# Patient Record
Sex: Female | Born: 1980 | Race: Black or African American | Hispanic: No | Marital: Single | Smoking: Never smoker
Health system: Southern US, Community
[De-identification: ages and names within clinical notes are randomized; demographics above are authoritative.]

## PROBLEM LIST (undated history)

## (undated) DIAGNOSIS — D473 Essential (hemorrhagic) thrombocythemia: Secondary | ICD-10-CM

## (undated) DIAGNOSIS — D75839 Thrombocytosis, unspecified: Secondary | ICD-10-CM

## (undated) DIAGNOSIS — T4145XA Adverse effect of unspecified anesthetic, initial encounter: Secondary | ICD-10-CM

## (undated) DIAGNOSIS — T8859XA Other complications of anesthesia, initial encounter: Secondary | ICD-10-CM

## (undated) HISTORY — PX: BONE MARROW BIOPSY: SHX199

## (undated) HISTORY — PX: WISDOM TOOTH EXTRACTION: SHX21

## (undated) HISTORY — PX: HYSTEROSCOPY: SHX211

## (undated) HISTORY — PX: IUD REMOVAL: SHX5392

---

## 2006-01-27 ENCOUNTER — Ambulatory Visit (HOSPITAL_COMMUNITY): Admission: RE | Admit: 2006-01-27 | Discharge: 2006-01-27 | Payer: Self-pay | Admitting: Obstetrics and Gynecology

## 2013-09-29 ENCOUNTER — Ambulatory Visit
Admission: RE | Admit: 2013-09-29 | Discharge: 2013-09-29 | Disposition: A | Discharge status: Home / Self Care | Payer: BC Managed Care – PPO | Source: Ambulatory Visit | Attending: Family Medicine | Admitting: Family Medicine

## 2013-09-29 ENCOUNTER — Other Ambulatory Visit: Payer: Self-pay | Admitting: Family Medicine

## 2013-09-29 DIAGNOSIS — R059 Cough, unspecified: Secondary | ICD-10-CM

## 2013-09-29 DIAGNOSIS — R05 Cough: Secondary | ICD-10-CM

## 2013-09-29 DIAGNOSIS — R0602 Shortness of breath: Secondary | ICD-10-CM

## 2013-10-01 ENCOUNTER — Other Ambulatory Visit: Payer: Self-pay | Admitting: Obstetrics and Gynecology

## 2014-09-01 ENCOUNTER — Encounter: Payer: Self-pay | Admitting: Hematology & Oncology

## 2014-09-08 ENCOUNTER — Telehealth: Payer: Self-pay | Admitting: Hematology & Oncology

## 2014-09-08 NOTE — Telephone Encounter (Signed)
Left vm w NEW PATIENT today to remind them of their appointment with Dr. Ennever. Also, advised them to bring all medication bottles and insurance card information. ° °

## 2014-09-09 ENCOUNTER — Ambulatory Visit (HOSPITAL_BASED_OUTPATIENT_CLINIC_OR_DEPARTMENT_OTHER): Payer: Federal, State, Local not specified - PPO | Admitting: Family

## 2014-09-09 ENCOUNTER — Encounter: Payer: Self-pay | Admitting: Family

## 2014-09-09 ENCOUNTER — Other Ambulatory Visit: Payer: Self-pay | Admitting: Family

## 2014-09-09 ENCOUNTER — Ambulatory Visit: Payer: Federal, State, Local not specified - PPO

## 2014-09-09 ENCOUNTER — Other Ambulatory Visit (HOSPITAL_BASED_OUTPATIENT_CLINIC_OR_DEPARTMENT_OTHER): Payer: Federal, State, Local not specified - PPO

## 2014-09-09 VITALS — BP 112/63 | HR 84 | Temp 98.1°F | Resp 14 | Ht 64.0 in | Wt 195.0 lb

## 2014-09-09 DIAGNOSIS — D75839 Thrombocytosis, unspecified: Secondary | ICD-10-CM

## 2014-09-09 DIAGNOSIS — D72829 Elevated white blood cell count, unspecified: Secondary | ICD-10-CM

## 2014-09-09 DIAGNOSIS — D473 Essential (hemorrhagic) thrombocythemia: Secondary | ICD-10-CM

## 2014-09-09 LAB — CBC WITH DIFFERENTIAL (CANCER CENTER ONLY)
BASO#: 0 10*3/uL (ref 0.0–0.2)
BASO%: 0.5 % (ref 0.0–2.0)
EOS%: 3.7 % (ref 0.0–7.0)
Eosinophils Absolute: 0.3 10*3/uL (ref 0.0–0.5)
HCT: 39.7 % (ref 34.8–46.6)
HEMOGLOBIN: 13.4 g/dL (ref 11.6–15.9)
LYMPH#: 2.6 10*3/uL (ref 0.9–3.3)
LYMPH%: 33.4 % (ref 14.0–48.0)
MCH: 27.2 pg (ref 26.0–34.0)
MCHC: 33.8 g/dL (ref 32.0–36.0)
MCV: 81 fL (ref 81–101)
MONO#: 0.4 10*3/uL (ref 0.1–0.9)
MONO%: 5.6 % (ref 0.0–13.0)
NEUT%: 56.8 % (ref 39.6–80.0)
NEUTROS ABS: 4.5 10*3/uL (ref 1.5–6.5)
Platelets: 556 10*3/uL — ABNORMAL HIGH (ref 145–400)
RBC: 4.93 10*6/uL (ref 3.70–5.32)
RDW: 13.7 % (ref 11.1–15.7)
WBC: 7.9 10*3/uL (ref 3.9–10.0)

## 2014-09-09 LAB — CHCC SATELLITE - SMEAR

## 2014-09-09 NOTE — Progress Notes (Signed)
Hematology/Oncology Consultation   Name: Bianca Smith      MRN: [id number]    Location: Room/bed info not found  Date: 09/09/2014 Time:4:00 PM   REFERRING PHYSICIAN: Everett Graff, MD  REASON FOR CONSULT: Thrombocytosis   DIAGNOSIS: Thrombocytosis  HISTORY OF PRESENT ILLNESS: Bianca Smith is a very pleasant 34 yo African American female with a history of elevated platelet counts. Her platelet count today is 556. She is asymptomatic with this.  She was sent here for a second opinion. She is schedule to have a bone marrow biopsy at [facility] on May 25th.  She has had 3 cousins pass away from leukemia in their 47's so of course she is concerned about this.  She has no personal history of cancer.  Her iron studies looked good. Her iron saturation was 59% and ferritin was 111. Her JAK 2 was negative. Her Hgb is 13.4 but MCV is low at 81. We did send off an alpha thalassemia test on her as well as a CALR mutation. She was in [employer] for 6 years and spent time all over the world including stents in [place] and [place]. 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 She does spend a lot of time on the road for her job and gets up at 4:30 am every day. She feels that this definitely contributes to her fatigue.  She was born in [place]. Her family is originally from there. She grew up in [place] and has been her in [place] since getting out of the service a few years ago.  She has fatigue, migraines, blurry vision at times and some bloating. She has a Mirena in and only spots occasionally. She had one miscarriage 14 year ago with an unknown cause.  She denies fever, chills, n/v, cough, rash, dizziness, SOB, chest pain, palpitations, abdominal pain, constipation, diarrhea, blood in urine or stool.  No lymphadenopathy on assessment.  She has some chronic feet and hand swelling. She takes Hydrodiuril to help alleviate  this. No tenderness or numbness in her extremities. She does have some tingling in the bottoms of her feet occassionally. This comes and goes.  Her appetite is good and she is staying hydrated.    She does not smoke or drink alcohol.     ROS: All other 10 point review of systems is negative.   PAST MEDICAL HISTORY:   No past medical history on file.  ALLERGIES: Not on File    MEDICATIONS:  No current outpatient prescriptions on file prior to visit.   No current facility-administered medications on file prior to visit.     PAST SURGICAL HISTORY No past surgical history on file.  FAMILY HISTORY: No family history on file.  SOCIAL HISTORY:  reports that she has never smoked. She has never used smokeless tobacco. Her alcohol and drug histories are not on file.  PERFORMANCE STATUS: [name] performance status is 0 - Asymptomatic  PHYSICAL EXAM: Most Recent Vital Signs: Blood pressure 112/63, pulse 84, temperature 98.1 F (36.7 C), temperature source Oral, resp. rate 14, height $RemoveBe'5\' 4"'gjcFhvhQO$  (1.626 m), weight 195 lb (88.451 kg). BP 112/63 mmHg  Pulse 84  Temp(Src) 98.1 F (36.7 C) (Oral)  Resp 14  Ht $R'5\' 4"'dC$  (1.626 m)  Wt 195 lb (88.451 kg)  BMI 33.46 kg/m2  General Appearance:    Alert, cooperative, no distress, appears stated age  Head:  Normocephalic, without obvious abnormality, atraumatic  Eyes:    PERRL, conjunctiva/corneas clear, EOM's intact, fundi    benign, both eyes        Throat:   Lips, mucosa, and tongue normal; teeth and gums normal  Neck:   Supple, symmetrical, trachea midline, no adenopathy;    thyroid:  no enlargement/tenderness/nodules; no carotid   bruit or JVD  Back:     Symmetric, no curvature, ROM normal, no CVA tenderness  Lungs:     Clear to auscultation bilaterally, respirations unlabored  Chest Wall:    No tenderness or deformity   Heart:    Regular rate and rhythm, S1 and S2 normal, no murmur, rub   or gallop     Abdomen:     Soft,  non-tender, bowel sounds active all four quadrants,    no masses, no organomegaly        Extremities:   Extremities normal, atraumatic, no cyanosis or edema  Pulses:   2+ and symmetric all extremities  Skin:   Skin color, texture, turgor normal, no rashes or lesions  Lymph nodes:   Cervical, supraclavicular, and axillary nodes normal  Neurologic:   CNII-XII intact, normal strength, sensation and reflexes    throughout   LABORATORY DATA:  Results for orders placed or performed in visit on 09/09/14 (from the past 48 hour(s))  CBC with Differential [facility])     Status: Abnormal   Collection Time: 09/09/14  1:31 PM  Result Value Ref Range   WBC 7.9 3.9 - 10.0 10e3/uL   RBC 4.93 3.70 - 5.32 10e6/uL   HGB 13.4 11.6 - 15.9 g/dL   HCT 39.7 34.8 - 46.6 %   MCV 81 81 - 101 fL   MCH 27.2 26.0 - 34.0 pg   MCHC 33.8 32.0 - 36.0 g/dL   RDW 13.7 11.1 - 15.7 %   Platelets 556 (H) 145 - 400 10e3/uL   NEUT# 4.5 1.5 - 6.5 10e3/uL   LYMPH# 2.6 0.9 - 3.3 10e3/uL   MONO# 0.4 0.1 - 0.9 10e3/uL   Eosinophils Absolute 0.3 0.0 - 0.5 10e3/uL   BASO# 0.0 0.0 - 0.2 10e3/uL   NEUT% 56.8 39.6 - 80.0 %   LYMPH% 33.4 14.0 - 48.0 %   MONO% 5.6 0.0 - 13.0 %   EOS% 3.7 0.0 - 7.0 %   BASO% 0.5 0.0 - 2.0 %  [facility] - Smear     Status: None   Collection Time: 09/09/14  1:31 PM  Result Value Ref Range   Smear Result Smear Available       RADIOGRAPHY: No results found.     PATHOLOGY: None  ASSESSMENT/PLAN: Bianca Smith is a very pleasant 34 yo African American female with a history of elevated platelet counts. Her platelet count today is 556. She is asymptomatic with this.  She does have a significant family history of leukemia.  She will have her bone marrow biopsy on May 25th so we will watch for this.  He smear was viewed by Bianca Smith and he did not identify anything to suggest malignancy and no target cells. Her iron studies were normal and JAK 2 was negative.  Again her bone marrow  biopsy will be what will show [place] if there is anything going on. We will decide whether she needs to follow-up with [place] after seeing these results.  We went through her lab work in detail and I gave her a copy of today's CBC.  All questions  were answered. She knows to contact us with any problems, questions or concerns. We can certainly see her much sooner if necessary.  She was discussed with and also seen by Bianca Smith and he is in agreement with the aforementioned.   [facility]    Addendum:  I saw and examined the patient with Sarah. We looked at her blood smear. She had increased platelets but most appear to be uniform in size. She had a few large platelets. I do not see any immature or really abnormal platelets. Her red cells showed no nucleated red cells. She had no schistocytes. There is no target cells. White cells been normal in morphology maturation.  I would be very interested to see what this bone marrow test shows that she is going to have done. She is seeing a Engineer, civil (consulting) at [facility]. Her hematologist is very good. As such, I'll not sure as to why we are involved.  I would have to suspect that there may be a myeloproliferative disorder. Iron deficiency is always a possibility which would be more likely given her young age and her monthly cycles.  She is very nice. She served in [employer]. It was nice talking to her about this.  We will have to make sure that we get a report from the bone marrow biopsy.  We spent about 40-45 minutes with her.  Laurey Arrow

## 2014-09-12 LAB — IRON AND TIBC CHCC
%SAT: 22 % (ref 21–57)
Iron: 62 ug/dL (ref 41–142)
TIBC: 276 ug/dL (ref 236–444)
UIBC: 215 ug/dL (ref 120–384)

## 2014-09-12 LAB — FERRITIN CHCC: Ferritin: 86 ng/ml (ref 9–269)

## 2014-09-15 LAB — HEMOGLOBINOPATHY EVALUATION
HGB A: 97.9 % — AB (ref 96.8–97.8)
Hemoglobin Other: 0 %
Hgb A2 Quant: 2.1 % — ABNORMAL LOW (ref 2.2–3.2)
Hgb F Quant: 0 % (ref 0.0–2.0)
Hgb S Quant: 0 %

## 2014-09-15 LAB — ALPHA-THALASSEMIA GENOTYPR

## 2014-09-16 LAB — CALR MUTATAION(GENPATH)

## 2014-10-25 ENCOUNTER — Telehealth: Payer: Self-pay | Admitting: Family

## 2014-10-25 NOTE — Telephone Encounter (Signed)
I spoke with Ms. Bianca Smith over the phone yesterday to see how she was doing. She had her bone marrow biopsy in May and states that her pathology was negative. She is still being followed by Dr. Marilynn Rail with hem/onc at [facility]. She has had no problems. I did let her know that she was positive for alpha thalassemia and to start taking 1 mg of folic acid daily. She would also like [place] to fax the lab work we did to Dr. Georgiann Cocker and also Dr. Everett Graff her OBGYN that referred he to [place] for a second opinion. We will send them today.

## 2015-05-26 ENCOUNTER — Other Ambulatory Visit: Payer: Self-pay | Admitting: Obstetrics and Gynecology

## 2015-09-05 ENCOUNTER — Other Ambulatory Visit: Payer: Self-pay | Admitting: Obstetrics and Gynecology

## 2015-09-27 NOTE — Patient Instructions (Signed)
Your procedure is scheduled on:  Wednesday, October 11, 2015  Enter through the [employer] [facility] at:  6:00 AM  Pick up the phone at the desk and dial [phone].  Call this number if you have problems the morning of surgery: [phone].  Remember: Do NOT eat food or drink after:  Midnight Tuesday, October 10, 2015  Take these medicines the morning of surgery with a SIP OF WATER: None  Do NOT wear jewelry (body piercing), metal hair clips/bobby pins, make-up, or nail polish. Do NOT wear lotions, powders, or perfumes.  You may wear deodorant. Do NOT shave for 48 hours prior to surgery. Do NOT bring valuables to the hospital. Contacts, dentures, or bridgework may not be worn into surgery.  Leave suitcase in car.  After surgery it may be brought to your room.  For patients admitted to the hospital, checkout time is 11:00 AM the day of discharge.

## 2015-09-28 ENCOUNTER — Encounter (HOSPITAL_COMMUNITY)
Admission: RE | Admit: 2015-09-28 | Discharge: 2015-09-28 | Disposition: A | Discharge status: Home / Self Care | Payer: Federal, State, Local not specified - PPO | Source: Ambulatory Visit | Attending: Obstetrics and Gynecology | Admitting: Obstetrics and Gynecology

## 2015-09-28 ENCOUNTER — Encounter (HOSPITAL_COMMUNITY): Payer: Self-pay

## 2015-09-28 ENCOUNTER — Other Ambulatory Visit (HOSPITAL_COMMUNITY): Payer: Self-pay | Admitting: Obstetrics and Gynecology

## 2015-09-28 DIAGNOSIS — Z01812 Encounter for preprocedural laboratory examination: Secondary | ICD-10-CM | POA: Diagnosis not present

## 2015-09-28 HISTORY — DX: Essential (hemorrhagic) thrombocythemia: D47.3

## 2015-09-28 HISTORY — DX: Thrombocytosis, unspecified: D75.839

## 2015-09-28 HISTORY — DX: Other complications of anesthesia, initial encounter: T88.59XA

## 2015-09-28 HISTORY — DX: Adverse effect of unspecified anesthetic, initial encounter: T41.45XA

## 2015-09-28 LAB — CBC
HEMATOCRIT: 39.9 % (ref 36.0–46.0)
Hemoglobin: 13.5 g/dL (ref 12.0–15.0)
MCH: 26.7 pg (ref 26.0–34.0)
MCHC: 33.8 g/dL (ref 30.0–36.0)
MCV: 79 fL (ref 78.0–100.0)
PLATELETS: 558 10*3/uL — AB (ref 150–400)
RBC: 5.05 MIL/uL (ref 3.87–5.11)
RDW: 13.7 % (ref 11.5–15.5)
WBC: 8.3 10*3/uL (ref 4.0–10.5)

## 2015-09-28 LAB — BASIC METABOLIC PANEL
Anion gap: 11 (ref 5–15)
BUN: 16 mg/dL (ref 6–20)
CHLORIDE: 99 mmol/L — AB (ref 101–111)
CO2: 28 mmol/L (ref 22–32)
CREATININE: 1.17 mg/dL — AB (ref 0.44–1.00)
Calcium: 9.3 mg/dL (ref 8.9–10.3)
GFR calc Af Amer: >60 mL/min (ref >60)
GFR calc non Af Amer: 59 mL/min — ABNORMAL LOW (ref >60)
Glucose, Bld: 98 mg/dL (ref 65–99)
POTASSIUM: 3.5 mmol/L (ref 3.5–5.1)
Sodium: 138 mmol/L (ref 135–145)

## 2015-09-28 LAB — ABO/RH: ABO/RH(D): A POS

## 2015-09-28 LAB — TYPE AND SCREEN
ABO/RH(D): A POS
Antibody Screen: NEGATIVE

## 2015-09-28 NOTE — H&P (Signed)
Bianca Smith is a 35 y.o. female, P: 0-0-1-0 who  presents for a  robot assisted myomectomy because of symptomatic uterine fibroids.  The patient has known that she had uterine fibroids for over 14 years but in the past two years she  has experienced increased awareness of their presence.  Her symptoms include increasing pelvic pressure, constipation,  difficulty starting her urine stream and dyspareunia.  Her menstrual flow lasts from 7-10 days requiring a pad change up to 8 times a day.  For a while she had the Mirena IUD with good control of her menstrual flow however, it had to be removed hysteroscopically  due to displacement. The patient was given a trial of Lysteda as a means of curtailing her menstrual flow but that regimen had no effect.  Her periods are accompanied by cramping that she rates as 7/10 on a 10 point pain scale and is somewhat relieved with a heating pad.  A pelvic ultrasound done March 2017 showed: uterus: 5.74 x 4.43 x 4.00 cm with a left lateral sub-serosal fibroid:  8.01 x 6.76 x 7.91 cm;  ovaries were  not seen but adnexae appeared normal.  A reveiw of both medical and surgical management options were given to the patient however,  she has decided to proceed with surgical management in the form of myomectomy.   Past Medical History  OB History: G: 0;   P: 0-0-1-0  GYN History: menarche: 35 YO     LMP: 09/14/2015    Contracepton no method  The patient denies history of sexually transmitted disease.Marland Kitchen   Has a remote history of an  abnormal PAP smear.   Last PAP smear: 2015-normal  Medical History: Thrombocytosis  Surgical History: 2017 Hysteroscopic Removal of Displaced IUD, Dilatation and Curettage Denies problems with anesthesia except that she cries incessantly post anesthesia. No  history of blood transfusions.  Family History: Diabetes Mellitus and Hypertension  Social History: Single and employed by the [employer];  Denies tobacco use and occasionally uses  alcohol   Outpatient Encounter Prescriptions as of 09/28/2015  Medication Sig  . Multiple Vitamins-Minerals (MULTIVITAMIN ADULT) CHEW Chew 1 tablet by mouth daily.   No facility-administered encounter medications on file as of 09/28/2015.  Omeprazole 20 mg  daily prn HCTZ  25 mg daily prn  No Known Allergies   Denies sensitivity to peanuts, shellfish, soy, latex or adhesives.   ROS: Admits to glasses but  denies headache, vision changes, nasal congestion, dysphagia, tinnitus, dizziness, hoarseness, cough,  chest pain, shortness of breath, nausea, vomiting, diarrhea,  urinary frequency, urgency  dysuria, hematuria, vaginitis symptoms, pelvic pain, swelling of joints,easy bruising,  myalgias, arthralgias, skin rashes, unexplained weight loss and except as is mentioned in the history of present illness, patient's review of systems is otherwise negative.   Physical Exam  Bp: 118/68   P: 96 bpm  R: 18  Temperature: 98.6 degrees F orally   Weight: 192 lbs.  Height: 5'4"  BMI: 33  Neck: supple without masses or thyromegaly Lungs: clear to auscultation Heart: regular rate and rhythm Abdomen: soft, non-tender and no organomegaly Pelvic:EGBUS- wnl; vagina-normal rugae; uterus-10 weeks size, cervix without lesions or motion tenderness; adnexae-no tenderness or masses Extremities:  no clubbing, cyanosis or edema   Assesment: Symptomatic Uterine Fibroids   Disposition:  Robot-Assisted Laparoscopic Myomectomy reviewed with the patient.    The patient was given the indication for her procedures, along with the risks which include but are not limited to:  reaction  to anesthesia, damage to adjacent organs, infection, excessive bleeding, possible need for an open abdominal incision,  if the endometrial cavity is breached a C-section delivery would be needed and transient post operative facial edema.  Benefits of the robotic approach include lesser postoperative pain, less blood loss during  surgery, reduced risk of injury to other organs due to better visualization with a 3-D HD 10 times magnifying camera, shorter hospital stay between 0-1 night and rapid recovery with return to daily routine in 2-3 weeks. Although robotically-assisted myomectomy has a longer operative time than traditional laparotomy, in a patient with good medical history, the benefits usually outweigh the risks.   Patient informed about FDA warning on use of morcellator dated 08/20/2012.  Discussed:  1. Incidence of post-operative diagnosis of sarcoma in women undergoing a hysterectomy is 2:1000 2. Annual incidence of leiomyosarcoma is 0.64/100,000 women 3. Sarcomas have the highest incidence in women over 65 4. Power morcellation involves risks of spreading tissue / disease. In he case of undiagnosed cancer, it may adversely affect the patient's prognosis.  Also discussed:  1.Lack of tactile feedback with the robotic approach leaving non-visible fibroids impossible to resect with potential future growth.  2. Need for cesarean section if there is uterine cavity entry and/or multiple incisions  3. Risk of developing NEW fibroids estimated at 25 % Patient voices understanding and desires to proceed as planned with a Robot Assisted Laparoscopic Myomectomy with Morcellation at [place] on 10/11/2015.   CSN# [id number]   Nayel Purdy J. Florene Glen, PA-C  for Dr. Dede Query. Rivard

## 2015-09-29 ENCOUNTER — Encounter (HOSPITAL_COMMUNITY): Payer: Self-pay

## 2015-10-10 MED ORDER — DEXTROSE 5 % IV SOLN
2.0000 g | INTRAVENOUS | Status: AC
  Administered 2015-10-11: 2 g via INTRAVENOUS
  Filled 2015-10-10: qty 2

## 2015-10-11 ENCOUNTER — Ambulatory Visit (HOSPITAL_COMMUNITY): Payer: Federal, State, Local not specified - PPO | Admitting: Anesthesiology

## 2015-10-11 ENCOUNTER — Encounter (HOSPITAL_COMMUNITY): Payer: Self-pay | Admitting: *Deleted

## 2015-10-11 ENCOUNTER — Encounter (HOSPITAL_COMMUNITY)
Admission: RE | Disposition: A | Discharge status: Home / Self Care | Payer: Self-pay | Source: Ambulatory Visit | Attending: Obstetrics and Gynecology

## 2015-10-11 ENCOUNTER — Ambulatory Visit (HOSPITAL_COMMUNITY)
Admission: RE | Admit: 2015-10-11 | Discharge: 2015-10-12 | Disposition: A | Discharge status: Home / Self Care | Payer: Federal, State, Local not specified - PPO | Source: Ambulatory Visit | Attending: Obstetrics and Gynecology | Admitting: Obstetrics and Gynecology

## 2015-10-11 DIAGNOSIS — D259 Leiomyoma of uterus, unspecified: Secondary | ICD-10-CM | POA: Diagnosis not present

## 2015-10-11 DIAGNOSIS — N941 Unspecified dyspareunia: Secondary | ICD-10-CM | POA: Diagnosis not present

## 2015-10-11 DIAGNOSIS — Z6833 Body mass index (BMI) 33.0-33.9, adult: Secondary | ICD-10-CM | POA: Diagnosis not present

## 2015-10-11 DIAGNOSIS — E669 Obesity, unspecified: Secondary | ICD-10-CM | POA: Insufficient documentation

## 2015-10-11 HISTORY — PX: ROBOT ASSISTED MYOMECTOMY: SHX5142

## 2015-10-11 LAB — PREGNANCY, URINE: PREG TEST UR: NEGATIVE

## 2015-10-11 SURGERY — ROBOTIC ASSISTED MYOMECTOMY
Anesthesia: General | Site: Abdomen

## 2015-10-11 MED ORDER — GLYCOPYRROLATE 0.2 MG/ML IJ SOLN
INTRAMUSCULAR | Status: AC
  Filled 2015-10-11: qty 6

## 2015-10-11 MED ORDER — BUPIVACAINE HCL (PF) 0.25 % IJ SOLN: INTRAMUSCULAR | Status: DC | PRN

## 2015-10-11 MED ORDER — SODIUM CHLORIDE 0.9 % IV SOLN
INTRAVENOUS | Status: DC | PRN
  Administered 2015-10-11: 100 mL
  Administered 2015-10-11: 120 mL

## 2015-10-11 MED ORDER — LIDOCAINE HCL (CARDIAC) 20 MG/ML IV SOLN
INTRAVENOUS | Status: DC | PRN
  Administered 2015-10-11: 50 mg via INTRAVENOUS

## 2015-10-11 MED ORDER — VASOPRESSIN 20 UNIT/ML IV SOLN
INTRAVENOUS | Status: DC | PRN
  Administered 2015-10-11: 65 mL via INTRAMUSCULAR

## 2015-10-11 MED ORDER — ONDANSETRON HCL 4 MG PO TABS: 4.0000 mg | ORAL_TABLET | Freq: Three times a day (TID) | ORAL | Status: DC | PRN

## 2015-10-11 MED ORDER — ARTIFICIAL TEARS OP OINT
TOPICAL_OINTMENT | OPHTHALMIC | Status: DC | PRN
  Administered 2015-10-11: 1 via OPHTHALMIC

## 2015-10-11 MED ORDER — OXYCODONE-ACETAMINOPHEN 5-325 MG PO TABS: 1.0000 | ORAL_TABLET | Freq: Four times a day (QID) | ORAL | Status: DC | PRN

## 2015-10-11 MED ORDER — IBUPROFEN 600 MG PO TABS: 600.0000 mg | ORAL_TABLET | Freq: Four times a day (QID) | ORAL | Status: DC | PRN

## 2015-10-11 MED ORDER — DEXAMETHASONE SODIUM PHOSPHATE 4 MG/ML IJ SOLN
INTRAMUSCULAR | Status: DC | PRN
  Administered 2015-10-11: 4 mg via INTRAVENOUS

## 2015-10-11 MED ORDER — FENTANYL CITRATE (PF) 100 MCG/2ML IJ SOLN
INTRAMUSCULAR | Status: DC | PRN
  Administered 2015-10-11: 75 ug via INTRAVENOUS
  Administered 2015-10-11: 100 ug via INTRAVENOUS
  Administered 2015-10-11: 25 ug via INTRAVENOUS
  Administered 2015-10-11: 100 ug via INTRAVENOUS
  Administered 2015-10-11: 50 ug via INTRAVENOUS

## 2015-10-11 MED ORDER — MIDAZOLAM HCL 2 MG/2ML IJ SOLN
INTRAMUSCULAR | Status: AC
  Filled 2015-10-11: qty 2

## 2015-10-11 MED ORDER — IBUPROFEN 600 MG PO TABS: ORAL_TABLET | ORAL | Status: DC

## 2015-10-11 MED ORDER — DEXAMETHASONE SODIUM PHOSPHATE 4 MG/ML IJ SOLN
INTRAMUSCULAR | Status: AC
  Filled 2015-10-11: qty 2

## 2015-10-11 MED ORDER — OXYCODONE-ACETAMINOPHEN 5-325 MG PO TABS
1.0000 | ORAL_TABLET | ORAL | Status: DC | PRN
  Administered 2015-10-11 – 2015-10-12 (×4): 2 via ORAL
  Filled 2015-10-11 (×3): qty 2

## 2015-10-11 MED ORDER — NEOSTIGMINE METHYLSULFATE 10 MG/10ML IV SOLN
INTRAVENOUS | Status: AC
  Filled 2015-10-11: qty 1

## 2015-10-11 MED ORDER — ROCURONIUM BROMIDE 100 MG/10ML IV SOLN
INTRAVENOUS | Status: DC | PRN
  Administered 2015-10-11: 50 mg via INTRAVENOUS
  Administered 2015-10-11: 10 mg via INTRAVENOUS

## 2015-10-11 MED ORDER — ONDANSETRON HCL 4 MG/2ML IJ SOLN
INTRAMUSCULAR | Status: AC
  Filled 2015-10-11: qty 2

## 2015-10-11 MED ORDER — FENTANYL CITRATE (PF) 250 MCG/5ML IJ SOLN
INTRAMUSCULAR | Status: AC
  Filled 2015-10-11: qty 5

## 2015-10-11 MED ORDER — LACTATED RINGERS IV SOLN
INTRAVENOUS | Status: DC
  Administered 2015-10-11 (×3): via INTRAVENOUS

## 2015-10-11 MED ORDER — SUGAMMADEX SODIUM 200 MG/2ML IV SOLN
INTRAVENOUS | Status: AC
  Filled 2015-10-11: qty 2

## 2015-10-11 MED ORDER — SCOPOLAMINE 1 MG/3DAYS TD PT72
MEDICATED_PATCH | TRANSDERMAL | Status: AC
  Administered 2015-10-11: 1.5 mg
  Filled 2015-10-11: qty 1

## 2015-10-11 MED ORDER — SODIUM CHLORIDE 0.9 % IJ SOLN
INTRAMUSCULAR | Status: AC
  Filled 2015-10-11: qty 150

## 2015-10-11 MED ORDER — VASOPRESSIN 20 UNIT/ML IV SOLN
INTRAVENOUS | Status: AC
  Filled 2015-10-11: qty 1

## 2015-10-11 MED ORDER — GLYCOPYRROLATE 0.2 MG/ML IJ SOLN
INTRAMUSCULAR | Status: DC | PRN
  Administered 2015-10-11: 0.2 mg via INTRAVENOUS

## 2015-10-11 MED ORDER — LIDOCAINE HCL (PF) 1 % IJ SOLN
INTRAMUSCULAR | Status: AC
  Filled 2015-10-11: qty 5

## 2015-10-11 MED ORDER — LACTATED RINGERS IV SOLN: INTRAVENOUS | Status: DC

## 2015-10-11 MED ORDER — ONDANSETRON HCL 4 MG/2ML IJ SOLN: 4.0000 mg | Freq: Four times a day (QID) | INTRAMUSCULAR | Status: DC | PRN

## 2015-10-11 MED ORDER — FENTANYL CITRATE (PF) 100 MCG/2ML IJ SOLN
INTRAMUSCULAR | Status: AC
  Filled 2015-10-11: qty 2

## 2015-10-11 MED ORDER — PROPOFOL 10 MG/ML IV BOLUS
INTRAVENOUS | Status: DC | PRN
  Administered 2015-10-11: 200 mg via INTRAVENOUS

## 2015-10-11 MED ORDER — MIDAZOLAM HCL 5 MG/5ML IJ SOLN
INTRAMUSCULAR | Status: DC | PRN
  Administered 2015-10-11 (×2): 2 mg via INTRAVENOUS

## 2015-10-11 MED ORDER — ROPIVACAINE HCL 5 MG/ML IJ SOLN
INTRAMUSCULAR | Status: AC
  Filled 2015-10-11: qty 60

## 2015-10-11 MED ORDER — SODIUM CHLORIDE 0.9 % IJ SOLN
INTRAMUSCULAR | Status: AC
  Filled 2015-10-11: qty 50

## 2015-10-11 MED ORDER — KETOROLAC TROMETHAMINE 30 MG/ML IJ SOLN
INTRAMUSCULAR | Status: DC | PRN
  Administered 2015-10-11: 30 mg via INTRAVENOUS

## 2015-10-11 MED ORDER — KETOROLAC TROMETHAMINE 30 MG/ML IJ SOLN: 30.0000 mg | Freq: Once | INTRAMUSCULAR | Status: DC

## 2015-10-11 MED ORDER — HYDROMORPHONE HCL 1 MG/ML IJ SOLN
INTRAMUSCULAR | Status: AC
  Administered 2015-10-11: 0.5 mg via INTRAVENOUS
  Filled 2015-10-11: qty 1

## 2015-10-11 MED ORDER — OXYCODONE HCL 5 MG PO TABS: 5.0000 mg | ORAL_TABLET | Freq: Once | ORAL | Status: DC | PRN

## 2015-10-11 MED ORDER — KETOROLAC TROMETHAMINE 30 MG/ML IJ SOLN
INTRAMUSCULAR | Status: AC
  Filled 2015-10-11: qty 1

## 2015-10-11 MED ORDER — MENTHOL 3 MG MT LOZG: 1.0000 | LOZENGE | OROMUCOSAL | Status: DC | PRN

## 2015-10-11 MED ORDER — SCOPOLAMINE 1 MG/3DAYS TD PT72: 1.0000 | MEDICATED_PATCH | Freq: Once | TRANSDERMAL | Status: DC

## 2015-10-11 MED ORDER — ONDANSETRON HCL 4 MG/2ML IJ SOLN
INTRAMUSCULAR | Status: DC | PRN
  Administered 2015-10-11: 4 mg via INTRAVENOUS

## 2015-10-11 MED ORDER — SUGAMMADEX SODIUM 200 MG/2ML IV SOLN
INTRAVENOUS | Status: DC | PRN
  Administered 2015-10-11: 175.6 mg via INTRAVENOUS

## 2015-10-11 MED ORDER — BUPIVACAINE HCL (PF) 0.25 % IJ SOLN
INTRAMUSCULAR | Status: AC
  Filled 2015-10-11: qty 30

## 2015-10-11 MED ORDER — PROPOFOL 10 MG/ML IV BOLUS
INTRAVENOUS | Status: AC
  Filled 2015-10-11: qty 20

## 2015-10-11 MED ORDER — ARTIFICIAL TEARS OP OINT
TOPICAL_OINTMENT | OPHTHALMIC | Status: AC
  Filled 2015-10-11: qty 3.5

## 2015-10-11 MED ORDER — OXYCODONE HCL 5 MG/5ML PO SOLN: 5.0000 mg | Freq: Once | ORAL | Status: DC | PRN

## 2015-10-11 MED ORDER — HYDROMORPHONE HCL 1 MG/ML IJ SOLN
0.2500 mg | INTRAMUSCULAR | Status: DC | PRN
  Administered 2015-10-11 (×2): 0.5 mg via INTRAVENOUS

## 2015-10-11 SURGICAL SUPPLY — 84 items
APL SKNCLS STERI-STRIP NONHPOA (GAUZE/BANDAGES/DRESSINGS) ×1
BARRIER ADHS 3X4 INTERCEED (GAUZE/BANDAGES/DRESSINGS) ×3 IMPLANT
BENZOIN TINCTURE PRP APPL 2/3 (GAUZE/BANDAGES/DRESSINGS) ×2 IMPLANT
BLADE SURG 10 STRL SS (BLADE) ×2 IMPLANT
BRR ADH 4X3 ABS CNTRL BYND (GAUZE/BANDAGES/DRESSINGS) ×2
CATH FOLEY 3WAY  5CC 18FR (CATHETERS) ×1
CATH FOLEY 3WAY 5CC 18FR (CATHETERS) ×1 IMPLANT
CELL SAVER LIPIGURD (MISCELLANEOUS) IMPLANT
CLOTH BEACON ORANGE TIMEOUT ST (SAFETY) ×2 IMPLANT
CONT PATH 16OZ SNAP LID 3702 (MISCELLANEOUS) ×2 IMPLANT
COVER BACK TABLE 60X90IN (DRAPES) ×4 IMPLANT
COVER TIP SHEARS 8 DVNC (MISCELLANEOUS) ×1 IMPLANT
COVER TIP SHEARS 8MM DA VINCI (MISCELLANEOUS) ×1
DECANTER SPIKE VIAL GLASS SM (MISCELLANEOUS) ×6 IMPLANT
DEFOGGER SCOPE WARMER CLEARIFY (MISCELLANEOUS) ×2 IMPLANT
DEVICE RETRIEVAL ALEXIS 14 (MISCELLANEOUS) IMPLANT
DRAPE INSTRUMENT ARM DA VINCI (DRAPES) ×1
DRAPE INSTRUMENT ARM DVNC (DRAPES) IMPLANT
DRSG COVADERM PLUS 2X2 (GAUZE/BANDAGES/DRESSINGS) ×8 IMPLANT
DRSG OPSITE POSTOP 3X4 (GAUZE/BANDAGES/DRESSINGS) ×2 IMPLANT
DURAPREP 26ML APPLICATOR (WOUND CARE) ×2 IMPLANT
ELECT REM PT RETURN 9FT ADLT (ELECTROSURGICAL) ×2
ELECTRODE REM PT RTRN 9FT ADLT (ELECTROSURGICAL) ×1 IMPLANT
EXTRT SYSTEM ALEXIS 14CM (MISCELLANEOUS)
FILTER SMOKE EVAC LAPAROSHD (FILTER) ×2 IMPLANT
GAUZE VASELINE 3X9 (GAUZE/BANDAGES/DRESSINGS) ×1 IMPLANT
GLOVE BIOGEL PI IND STRL 7.0 (GLOVE) ×3 IMPLANT
GLOVE BIOGEL PI INDICATOR 7.0 (GLOVE) ×3
GLOVE ECLIPSE 6.5 STRL STRAW (GLOVE) ×8 IMPLANT
KIT ABG SYR 3ML LUER SLIP (SYRINGE) ×2 IMPLANT
KIT ACCESSORY DA VINCI DISP (KITS) ×1
KIT ACCESSORY DVNC DISP (KITS) ×1 IMPLANT
LEGGING LITHOTOMY PAIR STRL (DRAPES) ×2 IMPLANT
LIQUID BAND (GAUZE/BANDAGES/DRESSINGS) ×2 IMPLANT
MANIPULATOR UTERINE 4.5 ZUMI (MISCELLANEOUS) IMPLANT
MORCELLATOR ROTARY HYSTERO (ABLATOR) ×1 IMPLANT
NDL SPNL 22GX7 QUINCKE BK (NEEDLE) IMPLANT
NEEDLE HYPO 22GX1.5 SAFETY (NEEDLE) ×2 IMPLANT
NEEDLE INSUFFLATION 120MM (ENDOMECHANICALS) IMPLANT
NEEDLE SPNL 22GX7 QUINCKE BK (NEEDLE) ×2 IMPLANT
NS IRRIG 1000ML POUR BTL (IV SOLUTION) ×6 IMPLANT
PACK ROBOT WH (CUSTOM PROCEDURE TRAY) ×2 IMPLANT
PACK ROBOTIC GOWN (GOWN DISPOSABLE) ×2 IMPLANT
PAD TRENDELENBURG POSITION (MISCELLANEOUS) ×2 IMPLANT
PORT ACCESS TROCAR AIRSEAL 12 (TROCAR) ×1 IMPLANT
PORT ACCESS TROCAR AIRSEAL 5M (TROCAR) ×2
SET CYSTO W/LG BORE CLAMP LF (SET/KITS/TRAYS/PACK) IMPLANT
SET IRRIG TUBING LAPAROSCOPIC (IRRIGATION / IRRIGATOR) ×2 IMPLANT
SET TRI-LUMEN FLTR TB AIRSEAL (TUBING) ×1 IMPLANT
SLEEVE XCEL OPT CAN 5 100 (ENDOMECHANICALS) ×2 IMPLANT
STRIP CLOSURE SKIN 1/2X4 (GAUZE/BANDAGES/DRESSINGS) ×2 IMPLANT
SUT DVC VLOC 180 0 12IN GS21 (SUTURE)
SUT DVC VLOC 180 2-0 12IN GS21 (SUTURE)
SUT MNCRL AB 3-0 PS2 27 (SUTURE) ×4 IMPLANT
SUT VIC AB 0 CT1 27 (SUTURE) ×2
SUT VIC AB 0 CT1 27XBRD ANBCTR (SUTURE) IMPLANT
SUT VIC AB 0 CT1 27XBRD ANTBC (SUTURE) IMPLANT
SUT VIC AB 0 CT2 27 (SUTURE) IMPLANT
SUT VIC AB 2-0 CT2 27 (SUTURE) ×2 IMPLANT
SUT VICRYL 0 UR6 27IN ABS (SUTURE) ×4 IMPLANT
SUT VLOC 180 0 9IN  GS21 (SUTURE) ×1
SUT VLOC 180 0 9IN GS21 (SUTURE) IMPLANT
SUT VLOC 180 2-0 6IN GS21 (SUTURE) IMPLANT
SUT VLOC 180 2-0 9IN GS21 (SUTURE) ×1 IMPLANT
SUTURE DVC VL 180 2-0 12INGS21 (SUTURE) IMPLANT
SUTURE DVC VLC 180 0 12IN GS21 (SUTURE) IMPLANT
SYR 50ML LL SCALE MARK (SYRINGE) ×2 IMPLANT
SYR CONTROL 10ML LL (SYRINGE) ×3 IMPLANT
SYRINGE 10CC LL (SYRINGE) IMPLANT
SYRINGE 60CC LL (MISCELLANEOUS) ×1 IMPLANT
SYSTEM CARTER THOMASON II (TROCAR) ×1 IMPLANT
SYSTEM CONVERTIBLE TROCAR (TROCAR) ×1 IMPLANT
TIP UTERINE 5.1X6CM LAV DISP (MISCELLANEOUS) IMPLANT
TIP UTERINE 6.7X10CM GRN DISP (MISCELLANEOUS) IMPLANT
TIP UTERINE 6.7X6CM WHT DISP (MISCELLANEOUS) ×1 IMPLANT
TIP UTERINE 6.7X8CM BLUE DISP (MISCELLANEOUS) ×1 IMPLANT
TOWEL OR 17X24 6PK STRL BLUE (TOWEL DISPOSABLE) ×6 IMPLANT
TRAY FOLEY BAG SILVER LF 16FR (SET/KITS/TRAYS/PACK) ×2 IMPLANT
TROCAR 12M 150ML BLUNT (TROCAR) IMPLANT
TROCAR DISP BLADELESS 8 DVNC (TROCAR) IMPLANT
TROCAR DISP BLADELESS 8MM (TROCAR) ×1
TROCAR OPTI TIP 12M 100M (ENDOMECHANICALS) IMPLANT
TROCAR PORT AIRSEAL 5X120 (TROCAR) IMPLANT
TROCAR XCEL 12X100 BLDLESS (ENDOMECHANICALS) ×1 IMPLANT

## 2015-10-11 NOTE — Anesthesia Preprocedure Evaluation (Signed)
Anesthesia Evaluation  Patient identified by MRN, date of birth, ID band Patient awake    Reviewed: Allergy & Precautions, NPO status , Patient's Chart, lab work & pertinent test results  Airway Mallampati: II   Neck ROM: full    Dental   Pulmonary neg pulmonary ROS,  breath sounds clear to auscultation        Cardiovascular negative cardio ROS  Rhythm:regular Rate:Normal     Neuro/Psych    GI/Hepatic   Endo/Other  obese  Renal/GU      Musculoskeletal   Abdominal   Peds  Hematology   Anesthesia Other Findings   Reproductive/Obstetrics                             Anesthesia Physical Anesthesia Plan  ASA: II  Anesthesia Plan: General   Post-op Pain Management:    Induction: Intravenous  Airway Management Planned: Oral ETT  Additional Equipment:   Intra-op Plan:   Post-operative Plan: Extubation in OR  Informed Consent: I have reviewed the patients History and Physical, chart, labs and discussed the procedure including the risks, benefits and alternatives for the proposed anesthesia with the patient or authorized representative who has indicated his/her understanding and acceptance.     Plan Discussed with: CRNA, Anesthesiologist and Surgeon  Anesthesia Plan Comments:         Anesthesia Quick Evaluation  

## 2015-10-11 NOTE — OR Nursing (Signed)
Upon arrival from or Bianca Smith emotional teary eyed asking to see dr Mancel Bale. Dr Mancel Bale comes to see Bianca Smith.  Off and on Bianca Smith teary eyed asking to see dr Mancel Bale again.  Dr Mancel Bale comes back to see patient at approx 11:05.  Bianca Smith given pain medication and is now relaxing quietly. Darin Engels rn

## 2015-10-11 NOTE — Op Note (Addendum)
Preoperative diagnosis: Symptomatic uterine fibroids   Postoperative diagnosis: Same   Anesthesia: General   Anesthesiologist: Dr. Marcie Bal  Procedure: Robotically assisted myomectomy   Surgeon: Dr. Katharine Look Epic Tribbett   Assistant: Earnstine Regal P.A.-C .  Estimated blood loss: 50 cc   Procedure:   After being informed of the planned procedure with possible complications including but not limited to bleeding, infection, injury to other organs, need for laparotomy, possible need for morcellation with risks and benefits reviewed, expected hospital stay and recovery, informed consent is obtained and patient is taken to or #7. She is placed in lithotomy position on a Pink pad with both arms padded and tucked on each side and bilateral knee-high sequential compressive devices. She is given general anesthesia with endotracheal intubation without any complication. She is prepped and draped in a sterile fashion. A three-way Foley catheter is inserted in her bladder.   Pelvic exam reveals: a large posterior fibroid filling the posterior cul-de-sac measuring about 9 cm  A weighted speculum is inserted in the vagina and the anterior lip of the cervix is grasped with a tenaculum forcep. We proceed with a paracervical block and vaginal infiltration using ropivacaine 0.5% diluted 1 in 1 with saline. The uterus was then sounded at 7 cm. We easily dilate the cervix using Hegar dilator to #27 which allows for easy placement of the intrauterine RUMI manipulator.  Trocar placement is decided. We infiltrate 6 cm above the umbilicus with 10 cc of ropivacaine per protocol and perform a 10 mm vertical incision which is brought down bluntly to the fascia. The fascia is identified and grasped with Coker forceps. The fascia is incised with Mayo scissors. Peritoneum is entered bluntly. A pursestring suture of 0 Vicryl is placed on the fascia and a 10 mm Hassan trocar is easily inserted in the abdominal cavity held in placed  with a Purstring suture. This allows for easy insufflation of a pneumoperitoneum using warmed CO2 at a maximum pressure of 15 mm of mercury. 60 cc of Ropivacaine 0.5 % diluted 1 in 1 is sent in the pelvis and the patient is positioned in reverse Trendelenburg. We then placed two 37mm robotic trocar on the left, one 88mm robotic trocar on the right and one 10 mm patient's side assistant trocar on the right after infiltrating every site with ropivacaine per protocol. The robot is docked on the left of the patient after positioning her in Trendelenburg. A monopolar scissor is inserted in arm #1, a PK gyrus forcep is inserted in arm #2 and a Tenaculum is inserted in arm #3.  Preparation and docking is completed in 1 hour .  Observation: The uterus is normal in size with a 1 cm right cornual fibroid and a 0.5 cm fundal fibroid. A 9 cm pedunculated fibroid fills the posterior cul-de-sac and measures 9 cm with a 3 cm stalk attached mid body.Both tubes and ovaries are normal. Both anterior and posterior cul-de-sac are normal. Liver is visualized and normal. Appendix is not visualized.   We began by infiltrating the stalk of the posterior fibroid  using vasopressin at a dilution of 20 units in 50 cc of saline until we obtain complete blanching. We then proceed with systematic removal of all 3 fibroids using monopolar and bipolar energy. The posterior incision is closed with a running suture of 0 V-Lock and a baseball suture of 2-0 V-Lock. The right cornual incision is closed with a baseball suture of 2-0 V-Lock.   We irrigated profusely to confirm a  satisfactory hemostasis. A sheet of Interceed is placed on all incisions.Consult time is completed in 1 hour and 30 minutes.   All instruments are then removed and the robot is undocked.   The patient side assistant 10 mm trocar is replaced with the morcellator under direct visualization. We then proceed with systematic morcellation of the specimen which takes 30  minutes.   We irrigated profusely to again confirm a satisfactory hemostasis.   All trochars are removed under direct visualization after evacuating the pneumoperitoneum.   The fascia of the supraumbilical incision is closed with the previously placed pursestring suture of 0 Vicryl. the fascia of the right patient side assistant trocar is closed with a figure-of-eight stitch of 0 Vicryl. All incisions are then closed with subcuticular suture of 3-0 Monocryl and Dermabond.   A speculum is inserted in the vagina to confirm adequate hemostasis on the cervix.   Instrument and sponge count is complete x2. Estimated blood loss is 50 cc.   The procedure is well tolerated by the patient who  is taken to recovery room in a well and stable condition.   Specimen: Fibroids weighing 225 g sent to pathology.  Incidentally, the uterine cavity WAS NOT entered and should the patient become pregnant, she would qualify for vaginal delivery.

## 2015-10-11 NOTE — Transfer of Care (Signed)
Immediate Anesthesia Transfer of Care Note  Patient: Bianca Smith  Procedure(s) Performed: Procedure(s) with comments: ROBOTIC ASSISTED MYOMECTOMY (N/A) - with power morcellation   Patient Location: PACU  Anesthesia Type:General  Level of Consciousness: awake, alert  and oriented  Airway & Oxygen Therapy: Patient Spontanous Breathing and Patient connected to nasal cannula oxygen  Post-op Assessment: Report given to RN and Post -op Vital signs reviewed and stable  Post vital signs: Reviewed and stable  Last Vitals:  Filed Vitals:   10/11/15 0631  BP: 116/85  Pulse: 106  Temp: 36.8 C  Resp: 18    Last Pain:  Filed Vitals:   10/11/15 0638  PainSc: 3       Patients Stated Pain Goal: 2 ([id number])  Complications: No apparent anesthesia complications

## 2015-10-11 NOTE — Anesthesia Procedure Notes (Signed)
Procedure Name: Intubation Date/Time: 10/11/2015 7:29 AM Performed by: Bufford Spikes Pre-anesthesia Checklist: Patient identified, Timeout performed, Emergency Drugs available, Suction available and Patient being monitored Patient Re-evaluated:Patient Re-evaluated prior to inductionOxygen Delivery Method: Circle system utilized Preoxygenation: Pre-oxygenation with 100% oxygen Intubation Type: IV induction Ventilation: Mask ventilation without difficulty Laryngoscope Size: Miller and 2 Grade View: Grade II Tube type: Oral Tube size: 7.0 mm Number of attempts: 1 Airway Equipment and Method: Stylet Placement Confirmation: ETT inserted through vocal cords under direct vision,  breath sounds checked- equal and bilateral and positive ETCO2 Secured at: 21 cm Tube secured with: Tape Dental Injury: Teeth and Oropharynx as per pre-operative assessment

## 2015-10-11 NOTE — Discharge Instructions (Signed)
Call [facility] @ [phone] if: ° °You have a temperature greater than or equal to 100.4 degrees Farenheit orally °You have pain that is not made better by the pain medication given and taken as directed °You have excessive bleeding or problems urinating ° °Take Colace (Docusate Sodium/Stool Softener) 100 mg 2-3 times daily while taking narcotic pain medicine to avoid constipation or until bowel movements are regular. °Take Ibuprofen 600 mg with food every 6 hours for 5 days then as needed for pain ° °You may drive after 1 week °You may walk up steps ° °You may shower tomorrow °You may resume a regular diet ° °Keep incisions clean and dry °Do not lift over 15 pounds for 6 weeks °Avoid anything in vagina for 6 weeks (or until after your post-operative visit) ° °

## 2015-10-11 NOTE — H&P (View-Only) (Signed)
Bianca Smith is a 35 y.o. female, P: 0-0-1-0 who  presents for a  robot assisted myomectomy because of symptomatic uterine fibroids.  The patient has known that she had uterine fibroids for over 14 years but in the past two years she  has experienced increased awareness of their presence.  Her symptoms include increasing pelvic pressure, constipation,  difficulty starting her urine stream and dyspareunia.  Her menstrual flow lasts from 7-10 days requiring a pad change up to 8 times a day.  For a while she had the Mirena IUD with good control of her menstrual flow however, it had to be removed hysteroscopically  due to displacement. The patient was given a trial of Lysteda as a means of curtailing her menstrual flow but that regimen had no effect.  Her periods are accompanied by cramping that she rates as 7/10 on a 10 point pain scale and is somewhat relieved with a heating pad.  A pelvic ultrasound done March 2017 showed: uterus: 5.74 x 4.43 x 4.00 cm with a left lateral sub-serosal fibroid:  8.01 x 6.76 x 7.91 cm;  ovaries were  not seen but adnexae appeared normal.  A reveiw of both medical and surgical management options were given to the patient however,  she has decided to proceed with surgical management in the form of myomectomy.   Past Medical History  OB History: G: 0;   P: 0-0-1-0  GYN History: menarche: 35 YO     LMP: 09/14/2015    Contracepton no method  The patient denies history of sexually transmitted disease.Marland Kitchen   Has a remote history of an  abnormal PAP smear.   Last PAP smear: 2015-normal  Medical History: Thrombocytosis  Surgical History: 2017 Hysteroscopic Removal of Displaced IUD, Dilatation and Curettage Denies problems with anesthesia except that she cries incessantly post anesthesia. No  history of blood transfusions.  Family History: Diabetes Mellitus and Hypertension  Social History: Single and employed by the [employer];  Denies tobacco use and occasionally uses  alcohol   Outpatient Encounter Prescriptions as of 09/28/2015  Medication Sig  . Multiple Vitamins-Minerals (MULTIVITAMIN ADULT) CHEW Chew 1 tablet by mouth daily.   No facility-administered encounter medications on file as of 09/28/2015.  Omeprazole 20 mg  daily prn HCTZ  25 mg daily prn  No Known Allergies   Denies sensitivity to peanuts, shellfish, soy, latex or adhesives.   ROS: Admits to glasses but  denies headache, vision changes, nasal congestion, dysphagia, tinnitus, dizziness, hoarseness, cough,  chest pain, shortness of breath, nausea, vomiting, diarrhea,  urinary frequency, urgency  dysuria, hematuria, vaginitis symptoms, pelvic pain, swelling of joints,easy bruising,  myalgias, arthralgias, skin rashes, unexplained weight loss and except as is mentioned in the history of present illness, patient's review of systems is otherwise negative.   Physical Exam  Bp: 118/68   P: 96 bpm  R: 18  Temperature: 98.6 degrees F orally   Weight: 192 lbs.  Height: 5'4"  BMI: 33  Neck: supple without masses or thyromegaly Lungs: clear to auscultation Heart: regular rate and rhythm Abdomen: soft, non-tender and no organomegaly Pelvic:EGBUS- wnl; vagina-normal rugae; uterus-10 weeks size, cervix without lesions or motion tenderness; adnexae-no tenderness or masses Extremities:  no clubbing, cyanosis or edema   Assesment: Symptomatic Uterine Fibroids   Disposition:  Robot-Assisted Laparoscopic Myomectomy reviewed with the patient.    The patient was given the indication for her procedures, along with the risks which include but are not limited to:  reaction  to anesthesia, damage to adjacent organs, infection, excessive bleeding, possible need for an open abdominal incision,  if the endometrial cavity is breached a C-section delivery would be needed and transient post operative facial edema.  Benefits of the robotic approach include lesser postoperative pain, less blood loss during  surgery, reduced risk of injury to other organs due to better visualization with a 3-D HD 10 times magnifying camera, shorter hospital stay between 0-1 night and rapid recovery with return to daily routine in 2-3 weeks. Although robotically-assisted myomectomy has a longer operative time than traditional laparotomy, in a patient with good medical history, the benefits usually outweigh the risks.   Patient informed about FDA warning on use of morcellator dated 08/20/2012.  Discussed:  1. Incidence of post-operative diagnosis of sarcoma in women undergoing a hysterectomy is 2:1000 2. Annual incidence of leiomyosarcoma is 0.64/100,000 women 3. Sarcomas have the highest incidence in women over 65 4. Power morcellation involves risks of spreading tissue / disease. In he case of undiagnosed cancer, it may adversely affect the patient's prognosis.  Also discussed:  1.Lack of tactile feedback with the robotic approach leaving non-visible fibroids impossible to resect with potential future growth.  2. Need for cesarean section if there is uterine cavity entry and/or multiple incisions  3. Risk of developing NEW fibroids estimated at 25 % Patient voices understanding and desires to proceed as planned with a Robot Assisted Laparoscopic Myomectomy with Morcellation at [place] on 10/11/2015.   CSN# [id number]   Marin Wisner J. Florene Glen, PA-C  for Dr. Dede Query. Rivard

## 2015-10-11 NOTE — Anesthesia Postprocedure Evaluation (Signed)
Anesthesia Post Note  Patient: Bianca Smith  Procedure(s) Performed: Procedure(s) (LRB): ROBOTIC ASSISTED MYOMECTOMY (N/A)  Patient location during evaluation: [facility] Anesthesia Type: General Level of consciousness: awake, awake and alert, oriented and patient cooperative Pain management: pain level not controlled (patient having pain; per RN patient received pain medication recently and will visit patient and intervene.) Vital Signs Assessment: post-procedure vital signs reviewed and stable Respiratory status: spontaneous breathing, nonlabored ventilation, respiratory function stable and patient connected to nasal cannula oxygen Cardiovascular status: stable Postop Assessment: no headache, no backache, patient able to bend at knees and no signs of nausea or vomiting Anesthetic complications: no     Last Vitals:  Filed Vitals:   10/11/15 1200 10/11/15 1215  BP:  121/66  Pulse: 95 86  Temp:  37.1 C  Resp: 18 20    Last Pain:  Filed Vitals:   10/11/15 1220  PainSc: 3    Pain Goal: Patients Stated Pain Goal: 2 (10/11/15 0631)               Flordia Kassem L

## 2015-10-11 NOTE — Interval H&P Note (Signed)
History and Physical Interval Note:  10/11/2015 7:10 AM  Bianca Smith  has presented today for surgery, with the diagnosis of Uterine Fibroid  The various methods of treatment have been discussed with the patient and family. After consideration of risks, benefits and other options for treatment, the patient has consented to  Procedure(s) with comments: ROBOTIC ASSISTED MYOMECTOMY (N/A) - with power morcellation  as a surgical intervention .  [name] history has been reviewed, patient examined, no change in status, stable for surgery.  I have reviewed the patient's chart and labs.  Questions were answered to the patient's satisfaction.     Bianca Smith A

## 2015-10-12 ENCOUNTER — Encounter (HOSPITAL_COMMUNITY): Payer: Self-pay | Admitting: Obstetrics and Gynecology

## 2015-10-12 DIAGNOSIS — D259 Leiomyoma of uterus, unspecified: Secondary | ICD-10-CM | POA: Diagnosis not present

## 2015-10-12 NOTE — Progress Notes (Signed)
Pt is discharged in the care of friend. Downstairs per ambulatory. Stable No equipment needed for home use. Understand all discharged instructions well. Abdominal laosites are clean and dry. Denies heavy bleeding.

## 2015-10-12 NOTE — Progress Notes (Signed)
Bianca Smith is a58 y.o.  [id number]  Post Op Date # Robot Assisted Myomectomy with Morcellation  Subjective: Patient is Doing well postoperatively. Patient has Pain is controlled with current analgesics. Medications being used: prescription NSAID's including Ibuprofen 600 mg and narcotic analgesics including Percocet. Ambulating in the halls, voiding and tolerating a regular diet.       Objective: Vital signs in last 24 hours: Temp:  [98.2 F (36.8 C)-98.8 F (37.1 C)] 98.5 F (36.9 C) (06/08 0448) Pulse Rate:  [72-115] 92 (06/08 0448) Resp:  [8-26] 18 (06/08 0448) BP: (98-128)/(50-82) 113/71 mmHg (06/08 0448) SpO2:  [96 %-100 %] 98 % (06/08 0448) FiO2 (%):  [2 %] 2 % (06/07 1315)  Intake/Output from previous day: 06/07 0701 - 06/08 0700 In: 3307 [P.O.:240; I.V.:3067] Out: [id number] [Urine:1450] Intake/Output this shift:   No results for input(s): WBC, HGB, HCT, PLT in the last 168 hours.  No results for input(s): NA, K, CL, CO2, BUN, CREATININE, CALCIUM, PROT, BILITOT, ALKPHOS, ALT, AST, GLUCOSE in the last 168 hours.  Invalid input(s): LABALBU  EXAM: General: alert, cooperative and no distress Resp: clear to auscultation bilaterally Cardio: regular rate and rhythm, S1, S2 normal, no murmur, click, rub or gallop GI: Very active bowel sounds;  umbilical dressing is clean/dry/intact; remaining abdominal incisions ae without evidence of infection and intact. Extremities: Homans sign is negative, no sign of DVT and no calf tenderness.   Assessment: s/p Procedure(s): ROBOTIC ASSISTED MYOMECTOMY: stable and progressing well  Plan: Discharge home today    [place], PA-C 10/12/2015 7:52 AM

## 2016-02-12 ENCOUNTER — Ambulatory Visit (INDEPENDENT_AMBULATORY_CARE_PROVIDER_SITE_OTHER): Payer: Federal, State, Local not specified - PPO | Admitting: Neurology

## 2016-02-12 ENCOUNTER — Encounter: Payer: Self-pay | Admitting: Neurology

## 2016-02-12 VITALS — BP 110/78 | HR 72 | Resp 20 | Ht 64.0 in | Wt 194.0 lb

## 2016-02-12 DIAGNOSIS — F4310 Post-traumatic stress disorder, unspecified: Secondary | ICD-10-CM

## 2016-02-12 DIAGNOSIS — G43101 Migraine with aura, not intractable, with status migrainosus: Secondary | ICD-10-CM

## 2016-02-12 DIAGNOSIS — G43A0 Cyclical vomiting, not intractable: Secondary | ICD-10-CM

## 2016-02-12 DIAGNOSIS — G4701 Insomnia due to medical condition: Secondary | ICD-10-CM | POA: Diagnosis not present

## 2016-02-12 MED ORDER — METOPROLOL SUCCINATE ER 25 MG PO TB24: 25.0000 mg | ORAL_TABLET | Freq: Every day | ORAL | 2 refills | Status: DC

## 2016-02-12 MED ORDER — VALPROATE SODIUM 500 MG/5ML IV SOLN: 500.0000 mg | Freq: Once | INTRAVENOUS | Status: DC

## 2016-02-12 NOTE — Patient Instructions (Signed)

## 2016-02-12 NOTE — Progress Notes (Signed)
S[facility]EP [facility]INE CLINIC   Provider:  Larey Seat, M D  Referring Provider: Hermine Messick, MD Primary Care Physician:  Hermine Messick, MD  Chief Complaint  Patient presents with  . New Patient (Initial Visit)    migraines weekly, has tried imitrex    HPI:  Bianca Smith is a 35 y.o. female , born on [place], and seen here as a referral from Dr. Hermine Messick of [facility] and Dr Everett Graff,( OB GYN )  for an evaluation of headaches.  Chief complaint according to patient : My headaches need to be evaluated and treated.   [name] has been seeing her obstetrician in September and reported her increasing frequency and intensity of what appears to be migrainous headaches. A CT of the head was ordered after she went through an emergency room on 01/15/2016. This did not reveal an abnormality. Her headaches were described as generalized, dull in quality,  radiating to the orbit, neck and shoulder, headaches can last up to 4 days which led to her emergency room visit. She said that the onset is gradual and constant, there is no waxing and waning nature to it. She did not tolerate bright lights very well, photophobia was present, the headaches have recurred and seemed to be worsening in intensity and frequency, light and sounds dose seemed to elevate the headaches and she has associated strong nausea. 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 Tinnitus is also associated with her headaches.  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  She is a PTSD sufferer. Stress and anxiety contribute to her headaches. She has insomnia, and this further becomes a cyclic reaction, developing into headaches , anxiety , fear.  She was evaluated at the [facility] for her headaches once, diagnosed with migrainous headaches, and given Imitrex but no preventative medication.    Sleep  habits are as follows: She usually tries to go to bed by around 8 PM but is unable to initiate sleep for a long time. She has to rise early usually is up at 4 AM and she works until 2 PM. Her bedroom is described as core, quiet and dark. She lives by herself, she is an established side sleeper. She sometimes elevate the head of bed to have better sleep. Many  nights she will not have to getup at all , at the most she has one bathroom break. She reports racing thoughts and recurrent falls keeping her from falling asleep or going back to sleep. She made a connection to her PTSD, depression and anxiety, the feeling of not having enough energy, insomnia, headaches dizziness, blurred vision constipation and ringing in the ears.   Sleep medical history and family sleep history: No history of family members with obstructive sleep apnea or other sleep disorders. 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 Single. She works for the [employer].   She goes to the gym regularly, 3-5 days per week and she walks her dog every day. She hydrates very well with water. She has limited her our goal intake, she has quit drinking caffeine, she does not use tobacco products of any kind  Review of Systems: Out of a complete 14 system review, the patient complains of only the following symptoms, and all other reviewed systems are negative. The patient reported an anxiety and panic attack after being treated with Fioricet with codeine ( ED [facility] ) , it created vivid, lifelike dreams and flashbacks, palpitations or diaphoresis. She was even more insomniac.  Epworth score 10-24. Always tired and fatigued, dragging through the day. Headaches, nausea. Visual aura.    Social History   Social History  . Marital status: Single    Spouse name: N/A  .  Number of children: N/A  . Years of education: N/A   Occupational History  . Not on file.   Social History Main Topics  . Smoking status: Never Smoker  . Smokeless tobacco: Never Used     Comment: NEVER USED TOBACCO  . Alcohol use 0.0 oz/week  . Drug use: No  . Sexual activity: Yes    Birth control/ protection: None   Other Topics Concern  . Not on file   Social History Narrative  . No narrative on file    No family history on file.  Past Medical History:  Diagnosis Date  . Complication of anesthesia    crying  . Thrombocytosis (HCC)     Past Surgical History:  Procedure Laterality Date  . BONE MARROW BIOPSY    . HYSTEROSCOPY    . IUD REMOVAL    . ROBOT ASSISTED MYOMECTOMY N/A 10/11/2015   Procedure: ROBOTIC ASSISTED MYOMECTOMY;  Surgeon: Silverio Lay, MD;  Location: [facility];  Service: Gynecology;  Laterality: N/A;  with power morcellation   . WISDOM TOOTH EXTRACTION      Current Outpatient Prescriptions  Medication Sig Dispense Refill  . ergocalciferol (VITAMIN D2) 50000 units capsule Take 50,000 Units by mouth once a week.    . linaclotide (LINZESS) 290 MCG CAPS capsule Take 290 mcg by mouth daily before breakfast.    . sertraline (ZOLOFT) 25 MG tablet Take 25 mg by mouth 2 (two) times daily.     No current facility-administered medications for this visit.     Allergies as of 02/12/2016  . (No Known Allergies)    Vitals: BP 110/78   Pulse 72   Resp 20   Ht 5\' 4"  (1.626 m)   Wt 194 lb (88 kg)   BMI 33.30 kg/m  Last Weight:  Wt Readings from Last 1 Encounters:  02/12/16 194 lb (88 kg)   04/13/16 mass index is 33.3 kg/m.     Last Height:   Ht Readings from Last 1 Encounters:  02/12/16 5\' 4"  (1.626 m)    Physical exam:  General: The patient is awake, alert and appears not in acute distress. The patient is well groomed. Head: Normocephalic, atraumatic. Neck is supple. Mallampati  3 neck circumference:15. Nasal airflow patent , wore braces.    Cardiovascular:  Regular rate and rhythm, without  murmurs or carotid bruit, and without distended neck veins. Respiratory: Lungs are clear to auscultation. Skin:  Without evidence of edema, or rash Trunk:[name] posture is erect   Neurologic exam : The patient is awake and alert, oriented to place and time.   Memory subjective described as/  appears normal.  Speech is fluent,  without dysarthria, dysphonia or aphasia.  Mood and affect are appropriate.  Cranial nerves: Pupils are equal and briskly reactive to light. Extraocular movements  in vertical and horizontal planes intact and without nystagmus. Visual fields by finger perimetry are intact. Hearing  to finger rub intact.   Facial sensation intact to fine touch.  Facial motor strength is symmetric and tongue and uvula move midline. Shoulder shrug was symmetrical.   Motor exam: Normal tone, muscle bulk and symmetric strength in all extremities.  Sensory:  Fine touch, pinprick and vibration and pProprioception tested in the upper extremities was normal.  Coordination: Rapid alternating movements in the fingers/hands was normal.  Finger-to-nose maneuver  normal without evidence of ataxia, dysmetria or tremor.  Gait and station: Patient walks without assistive device and is able unassisted to climb up to the exam table. Strength within normal limits.  Stance is stable and normal. Turns with  3  Steps. Romberg testing is negative.  Deep tendon reflexes: in the  upper and lower extremities are symmetric and intact. Babinski maneuver response is  downgoing.  The patient was advised of the nature of the diagnosed sleep disorder , the treatment options and risks for general a health and wellness arising from not treating the condition.  I spent more than  50  minutes of face to face time with the patient. Greater than 50% of time was spent in counseling and coordination of care. We have discussed the diagnosis and differential and I  answered the patient's questions.     Assessment:  After physical and neurologic examination, review of laboratory studies,  Personal review of imaging studies, reports of other /same  Imaging studies ,  Results of polysomnography/ neurophysiology testing and pre-existing records as far as provided in visit., my assessment is   1) [name] suffers clearly from a migrainous type headache associated with nausea, photophobia and a visual aura. She reports that her speech changes and that she has trouble finding words. She is also not as alert or able to orient herself. She has recently noticed an additional symptom of tinnitus.  2) her headaches have become more frequent and more intense. She has an additional, addition of cervicalgia that her MRI of the cervical spine performed on 01/25/2016 at the [facility]. No evidence of significant impingement or stenosis.   3)She has PTSD, a condition that predisposes her to poor sleep, vivid dreams and patients with this condition should not be prescribed narcotic pain medication as this increases the chance of nocturnal hallucinations poor sleep, anxiety and panic attacks. I asked her not to take Fioricet and she is actually not inclined to do so anyway.    Plan:  Treatment plan and additional workup :  Dear Dr. Mancel Bale, Thank you for this pleasant lady's referral to my clinic!   First of all Mrs. Brown's intensity and frequency of the headaches which now encompasses 4 days a week make it necessary for her to have an acute treatment to break the headache cycle and to begin taking a preventive medication.  I suggest 1 g of Depakote IV today she would not stay on this medication as it is teratogenic and not suitable for continuous use in a woman who wants to become pregnant.  I would also like for her to start a pretty dentist medication today but in order to find a pregnancy friendly medication I have a chance between zonisamide, Neurontin  and Lamictal. Beta blockers can also be used and often help patients with PTSD not to suffer from the adrenaline output surge therefore making PTSD flashbacks less panic provoking.   I like for her to keep a regular bedtime, I like for her to keep off caffeine as she has . to keep  the bedroom cool quiet and dark.  I would recommend to use a smart phone application at night called "calm".  When I revisit with her in 4-6 weeks , we will discuss a possible sleep study.    Asencion Partridge Debrah Granderson MD  02/12/2016   CC: Hermine Messick, Md [address] [place], [place] [address]

## 2016-03-21 ENCOUNTER — Encounter: Payer: Self-pay | Admitting: Neurology

## 2016-03-21 ENCOUNTER — Other Ambulatory Visit: Payer: Self-pay | Admitting: Obstetrics and Gynecology

## 2016-03-21 DIAGNOSIS — R928 Other abnormal and inconclusive findings on diagnostic imaging of breast: Secondary | ICD-10-CM

## 2016-03-25 ENCOUNTER — Encounter: Payer: Self-pay | Admitting: Neurology

## 2016-03-25 ENCOUNTER — Telehealth: Payer: Self-pay | Admitting: Neurology

## 2016-03-25 NOTE — Telephone Encounter (Signed)
Bianca Smith has called me because she felt lightheaded especially when she first stands up in the morning. She had her blood pressure taken last Friday and it was rather low. We spoke on the phone today after several myChart messages and I explained to her that she needs to take the medication Toprol at night not in the morning that she should hydrate well which she assured me she does. Maybe the need to advance the Toprol to it even slightly earlier time. She did not experience true vertigo and the dizziness is not unbearable. I think she may get used to the Toprol in  time. She will speak to her primary care physician, Dr. Mancel Bale, but sees me in a follow-up on December 4th. Toprol helps with migraines.   CD

## 2016-04-03 ENCOUNTER — Ambulatory Visit
Admission: RE | Admit: 2016-04-03 | Discharge: 2016-04-03 | Disposition: A | Discharge status: Home / Self Care | Payer: Federal, State, Local not specified - PPO | Source: Ambulatory Visit | Attending: Obstetrics and Gynecology | Admitting: Obstetrics and Gynecology

## 2016-04-03 DIAGNOSIS — R928 Other abnormal and inconclusive findings on diagnostic imaging of breast: Secondary | ICD-10-CM

## 2016-04-08 ENCOUNTER — Encounter: Payer: Self-pay | Admitting: Neurology

## 2016-04-08 ENCOUNTER — Ambulatory Visit (INDEPENDENT_AMBULATORY_CARE_PROVIDER_SITE_OTHER): Payer: Federal, State, Local not specified - PPO | Admitting: Neurology

## 2016-04-08 DIAGNOSIS — G43711 Chronic migraine without aura, intractable, with status migrainosus: Secondary | ICD-10-CM | POA: Diagnosis not present

## 2016-04-08 DIAGNOSIS — G4733 Obstructive sleep apnea (adult) (pediatric): Secondary | ICD-10-CM | POA: Diagnosis not present

## 2016-04-08 DIAGNOSIS — R0683 Snoring: Secondary | ICD-10-CM

## 2016-04-08 DIAGNOSIS — R519 Headache, unspecified: Secondary | ICD-10-CM

## 2016-04-08 DIAGNOSIS — R51 Headache: Secondary | ICD-10-CM | POA: Diagnosis not present

## 2016-04-08 NOTE — Progress Notes (Addendum)
SLEEP MEDICINE CLINIC   Provider:  Larey Smith, M D  Referring Provider: Hermine Messick, MD Primary Care Physician:  Bianca Messick, MD  Chief Complaint  Patient presents with  . Follow-up    Rm 11. Patient states that her headaches have been less intense but feels that her medication is making her feel dizzy.     HPI:  Bianca Smith is a 35 y.o. female , born on [place], and seen here as a referral from Dr. Hermine Smith of [facility] and Dr Bianca Smith,( OB GYN )  for an evaluation of headaches.  Chief complaint according to patient : My headaches need to be evaluated and treated.   Bianca Smith has been seeing her obstetrician in September and reported her increasing frequency and intensity of what appears to be migrainous headaches. A CT of the head was ordered after she went through an emergency room on 01/15/2016. This did not reveal an abnormality. Her headaches were described as generalized, dull in quality,  radiating to the orbit, neck and shoulder, headaches can last up to 4 days which led to her emergency room visit. She said that the onset is gradual and constant, there is no waxing and waning nature to it. She did not tolerate bright lights very well, photophobia was present, the headaches have recurred and seemed to be worsening in intensity and frequency, light and sounds dose seemed to elevate the headaches and she has associated strong nausea. 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 Tinnitus is also associated with her headaches.  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  She is a PTSD sufferer. Stress and anxiety contribute to her headaches. She has insomnia, and this further becomes a cyclic reaction, developing into headaches , anxiety , fear.  She was evaluated at the [facility] for her headaches once, diagnosed with migrainous  headaches, and given Imitrex but no preventative medication.    Sleep habits are as follows: She usually tries to go to bed by around 8 PM but is unable to initiate sleep for a long time. She has to rise early usually is up at 4 AM and she works until 2 PM. Her bedroom is described as core, quiet and dark. She lives by herself, she is an established side sleeper. She sometimes elevate the head of bed to have better sleep. Many  nights she will not have to getup at all , at the most she has one bathroom break. She reports racing thoughts and recurrent falls keeping her from falling asleep or going back to sleep. She made a connection to her PTSD, depression and anxiety, the feeling of not having enough energy, insomnia, headaches dizziness, blurred vision constipation and ringing in the ears.   Sleep medical history and family sleep history: No history of family members with obstructive sleep apnea or other sleep disorders. 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 Single. She works for the [employer].   She goes to the gym regularly, 3-5 days per week and she walks her dog every day. She hydrates very well with water. She has limited her our goal intake, she has quit  drinking caffeine, she does not use tobacco products of any kind    Review of Systems: Out of a complete 14 system review, the patient complains of only the following symptoms, and all other reviewed systems are negative. The patient reported an anxiety and panic attack after being treated with Fioricet with codeine ( ED [facility] ) , it created vivid, lifelike dreams and flashbacks, palpitations or diaphoresis. She was even more insomniac.  Epworth score 10-24. Always tired and fatigued, dragging through the day. Headaches, nausea. Visual aura.    Social History     Social History  . Marital status: Single    Spouse name: N/A  . Number of children: N/A  . Years of education: N/A   Occupational History  . Not on file.   Social History Main Topics  . Smoking status: Never Smoker  . Smokeless tobacco: Never Used     Comment: NEVER USED TOBACCO  . Alcohol use 0.0 oz/week  . Drug use: No  . Sexual activity: Yes    Birth control/ protection: None   Other Topics Concern  . Not on file   Social History Narrative  . No narrative on file    No family history on file.  Past Medical History:  Diagnosis Date  . Complication of anesthesia    crying  . Thrombocytosis ([place])     Past Surgical History:  Procedure Laterality Date  . BONE MARROW BIOPSY    . HYSTEROSCOPY    . IUD REMOVAL    . ROBOT ASSISTED MYOMECTOMY N/A 10/11/2015   Procedure: ROBOTIC ASSISTED MYOMECTOMY;  Surgeon: Delsa Bern, MD;  Location: [facility];  Service: Gynecology;  Laterality: N/A;  with power morcellation   . WISDOM TOOTH EXTRACTION      Current Outpatient Prescriptions  Medication Sig Dispense Refill  . ergocalciferol (VITAMIN D2) 50000 units capsule Take 50,000 Units by mouth once a week.    . linaclotide (LINZESS) 290 MCG CAPS capsule Take 290 mcg by mouth daily before breakfast.    . metoprolol succinate (TOPROL-XL) 25 MG 24 hr tablet Take 1 tablet (25 mg total) by mouth daily. At night po. 30 tablet 2  . sertraline (ZOLOFT) 25 MG tablet Take 25 mg by mouth 2 (two) times daily.     Current Facility-Administered Medications  Medication Dose Route Frequency Provider Last Rate Last Dose  . valproate (DEPACON) 500 mg in dextrose 5 % 50 mL IVPB  500 mg Intravenous Once Bianca Seat, MD        Allergies as of 04/08/2016  . (No Known Allergies)    Vitals: BP (!) 102/50   Pulse 66   Resp 16   Ht _0  (1.626 m)   Wt 191 lb (86.6 kg)   BMI 32.79 kg/m  Last Weight:  Wt Readings from Last 1 Encounters:  04/08/16 191 lb (86.6 kg)   LTJ:QZES mass index  is 32.79 kg/m.     Last Height:   Ht Readings from Last 1 Encounters:  04/08/16 _1  (1.626 m)    Physical exam:  General: The patient is awake, alert and appears not in acute distress. The patient is well groomed. Head: Normocephalic, atraumatic. Neck is supple. Mallampati  3 neck circumference:15. Nasal airflow patent , wore braces.  Cardiovascular:  Regular rate and rhythm, without  murmurs or carotid bruit, and without distended neck veins. Respiratory: Lungs are clear to auscultation. Skin:  Without evidence of edema, or rash Trunk:[name] posture is erect  Neurologic exam : The patient is awake and alert, oriented to place and time.   Memory subjective described as/  appears normal.  Speech is fluent, without dysarthria, dysphonia or aphasia.  Mood and affect are appropriate.  Cranial nerves: She reports occasional metallic taste in her mouth. No  Change in sense of smell.  Pupils are equal and briskly reactive to light. Extraocular movements  in vertical and horizontal planes intact and without nystagmus. Visual fields by finger perimetry are intact. Hearing to finger rub intact.  Facial sensation intact to fine touch. No facial dysesthesia. Lips have tingled.   Facial motor strength is symmetric , her tongue and uvula moved midline.  Shoulder shrug was symmetrical.   Motor exam: Normal tone, muscle bulk and symmetric strength in all extremities. Sensory:  Fine touch, pinprick and vibration and Proprioception normal. Coordination:Finger-to-nose maneuver without evidence of ataxia, dysmetria or tremor. Gait and station: Patient walks without assistive device and is able unassisted to climb up to the exam table. Strength within normal limits. Stance is stable and normal. Turns with 3 Steps. Deep tendon reflexes: in the  upper and lower extremities are symmetric and intact. Babinski maneuver response is  downgoing.  The patient was advised of the nature of the  diagnosed headache  disorder , namely migraine headaches,  the treatment options and risks for general a health and wellness arising from not treating the condition were discussed, the side effects. The correlation to sleep apnea.  I spent more than 20  minutes of face to face time with the patient. Greater than 50% of time was spent in counseling and coordination of care. We have discussed the diagnosis and differential and I answered the patient's questions.     Assessment:  After physical and neurologic examination, review of laboratory studies,  Personal review of imaging studies, reports of other /same  Imaging studies ,  Results of polysomnography/ neurophysiology testing and pre-existing records as far as provided in visit., my assessment is   1) Mrs. Owens Smith suffers clearly from a migrainous type headache associated with nausea, photophobia and a visual aura. She reports that her speech changes and that she has trouble finding words. She is also not as alert or able to orient herself. She has recently noticed an additional symptom of tinnitus. The tinnitus has improved but is not alleviated by her current medication regimen, her headaches have become less intense but not necessarily less frequent. She has noted some dizziness on preventive medication, little bit of lightheadedness, and she does feel off balance. rports orthostatic dizziness and vertigo- the room feels as if spinning around her.   2)She has PTSD, a condition that predisposes her to poor sleep, vivid dreams and patients with this condition should not be prescribed narcotic pain medication as this increases the chance of nocturnal hallucinations poor sleep, anxiety and panic attacks. She feels that she cannot sleep longer than 2 or 3 hours en bloc, she will get a total sleep time of 4 or 5 hours maximum, but not in one sleep period.  I asked her not to take Fioricet and she is actually not inclined to do so anyway. I will ask for a  sleep study.     Plan:  Treatment plan and additional workup :  Dear Dr. Everett Smith,    Thank you for this pleasant lady's referral to my clinic!   She has improved in terms of headache intensity but still has frequent headaches and she has more days  with and without headaches. 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 Toprol 25 mg in a 24-hour release form has been used, but it has given her slight orthostatic dizziness. For that reason I asked her to try a half tablet, knowing that it will no longer be extended release it may still prevent her headaches to the same degree without coughing daytime dizziness. A beta blocker is usually safe in pregnancy. In addition I have ordered a sleep study for her as she has been told that she snores, she is obese at this time, and she has a history of posttraumatic stress disorder. PTSD overlaps almost 60% with apnea. It is also important to find and treat apnea before she becomes pregnant as apnea is usually worse during pregnancy especially in the last trimester. Headaches can well be associated with hypercapnia or hypoxemia, and in general she does not sleep for long periods of time and would be considered sleep deprived.  I again instructed her to keep a regular bedtime,  keep off caffeine, and  to keep the bedroom cool quiet and dark.  Sleep study ordered.  MRI brain with and without contrast ordered for evaluation chronic migraine, on aura. CMET drawn first/.  Prep for contrast agent.  Rv in 5-6 weeks.     Asencion Partridge Mardell Suttles MD  04/08/2016   CC: Bianca Messick, Md [address] [place], [place] [address]

## 2016-04-08 NOTE — Addendum Note (Signed)
Addended by: Larey Seat on: 04/08/2016 03:59 PM   Modules accepted: Orders

## 2016-04-08 NOTE — Patient Instructions (Signed)
Take 1/2 tab of Toprol for the next 14  days, and keep a diary of headaches and sleep . CD

## 2016-04-09 LAB — COMPREHENSIVE METABOLIC PANEL
ALK PHOS: 59 IU/L (ref 39–117)
ALT: 14 IU/L (ref 0–32)
AST: 14 IU/L (ref 0–40)
Albumin/Globulin Ratio: 1.4 (ref 1.2–2.2)
Albumin: 4.1 g/dL (ref 3.5–5.5)
BILIRUBIN TOTAL: 0.2 mg/dL (ref 0.0–1.2)
BUN/Creatinine Ratio: 13 (ref 9–23)
BUN: 11 mg/dL (ref 6–20)
CHLORIDE: 102 mmol/L (ref 96–106)
CO2: 25 mmol/L (ref 18–29)
CREATININE: 0.87 mg/dL (ref 0.57–1.00)
Calcium: 9.3 mg/dL (ref 8.7–10.2)
GFR calc Af Amer: 98 mL/min/{1.73_m2} (ref >59)
GFR calc non Af Amer: 85 mL/min/{1.73_m2} (ref >59)
GLOBULIN, TOTAL: 2.9 g/dL (ref 1.5–4.5)
GLUCOSE: 76 mg/dL (ref 65–99)
Potassium: 4.3 mmol/L (ref 3.5–5.2)
SODIUM: 140 mmol/L (ref 134–144)
Total Protein: 7 g/dL (ref 6.0–8.5)

## 2016-04-10 ENCOUNTER — Telehealth: Payer: Self-pay

## 2016-04-10 NOTE — Telephone Encounter (Signed)
I spoke to patient and she is aware of results and recommendations.  

## 2016-04-10 NOTE — Telephone Encounter (Signed)
-----   Message from Larey Seat, MD sent at 04/09/2016  5:03 PM EST ----- All normal metabolic panel.

## 2016-04-24 ENCOUNTER — Ambulatory Visit
Admission: RE | Admit: 2016-04-24 | Discharge: 2016-04-24 | Disposition: A | Discharge status: Home / Self Care | Payer: Federal, State, Local not specified - PPO | Source: Ambulatory Visit | Attending: Neurology | Admitting: Neurology

## 2016-04-24 DIAGNOSIS — R51 Headache: Secondary | ICD-10-CM | POA: Diagnosis not present

## 2016-04-24 DIAGNOSIS — R519 Headache, unspecified: Secondary | ICD-10-CM

## 2016-04-24 DIAGNOSIS — G4733 Obstructive sleep apnea (adult) (pediatric): Secondary | ICD-10-CM

## 2016-04-24 DIAGNOSIS — G43711 Chronic migraine without aura, intractable, with status migrainosus: Secondary | ICD-10-CM

## 2016-05-01 ENCOUNTER — Telehealth: Payer: Self-pay

## 2016-05-01 NOTE — Telephone Encounter (Signed)
I LM (per DPR) with results below. I stated the next step is her sleep study and we will call her with those results once complete. Left call back number for further questions.

## 2016-05-01 NOTE — Telephone Encounter (Signed)
-----   Message from Larey Seat, MD sent at 04/30/2016  1:43 PM EST ----- Normal brain MRI

## 2016-05-19 ENCOUNTER — Ambulatory Visit (INDEPENDENT_AMBULATORY_CARE_PROVIDER_SITE_OTHER): Payer: Federal, State, Local not specified - PPO | Admitting: Neurology

## 2016-05-19 DIAGNOSIS — R0683 Snoring: Secondary | ICD-10-CM

## 2016-05-19 DIAGNOSIS — G4733 Obstructive sleep apnea (adult) (pediatric): Secondary | ICD-10-CM

## 2016-05-19 DIAGNOSIS — R519 Headache, unspecified: Secondary | ICD-10-CM

## 2016-05-19 DIAGNOSIS — R51 Headache: Secondary | ICD-10-CM

## 2016-05-20 ENCOUNTER — Telehealth: Payer: Self-pay

## 2016-05-20 ENCOUNTER — Ambulatory Visit: Payer: Federal, State, Local not specified - PPO | Admitting: Neurology

## 2016-05-20 NOTE — Telephone Encounter (Signed)
Pt did not show for their appt with Dr. Brett Fairy today.  Pt called office to inform us she will not be coming to her appt today and r/s for 07/08/16.

## 2016-05-20 NOTE — Procedures (Signed)
PATIENT'S NAME:  Bianca Smith, Bianca Smith DOB:      11-Feb-1981      MR#:    [id number]     DATE OF RECORDING: 05/19/2016 REFERRING M.D.:  Hermine Messick MD Study Performed:   Baseline Polysomnogram HISTORY:  [name] has been seeing her obstetrician in September and reported her increasing frequency and intensity of what appears to be migraines headaches. A CT of the head did not reveal any abnormality. Her headaches were lasting up to 4 days which led to her emergency room visit. She said that the onset is gradual and constant, there is no waxing and waning nature to it. She did not tolerate bright lights very well, photophobia was present, light and sounds seem to elevate the headaches and she has associated strong nausea. She reports also that her visual field changes that she has floating and bright lights flashing lights that'll qualify as a visual aura. Tinnitus is also associated with her headaches. She has a history of thrombocytosis. She is obese and she may snore. Has insomnia and PTSD.   The patient endorsed the Epworth Sleepiness Scale at 15/24 points.    [name] weight 192 pounds with a height of 64 (inches), resulting in a BMI of 32.7 kg/m2.  [name] neck circumference measured 15 inches.  CURRENT MEDICATIONS: Vitamin D2, Linzess, Toprol XL, Zoloft   PROCEDURE:  This is a multichannel digital polysomnogram utilizing the Somnostar 11.2 system.  Electrodes and sensors were applied and monitored per AASM Specifications.   EEG, EOG, Chin and Limb EMG, were sampled at 200 Hz.  ECG, Snore and Nasal Pressure, Thermal Airflow, Respiratory Effort, CPAP Flow and Pressure, Oximetry was sampled at 50 Hz. Digital video and audio were recorded.      BASELINE STUDY  Lights Out was at 22:40 and Lights On at 04:59.  Total recording time (TRT) was 380 minutes, with a total sleep time (TST) of 287.5 minutes.   [name] sleep latency was 34.5 minutes.  REM latency was 141.5 minutes.  The sleep  efficiency was 75.7 %.     SLEEP ARCHITECTURE: WASO (Wake after sleep onset) was 19.5 minutes.  There were 10 minutes in Stage N1, 162.5 minutes Stage N2, 27 minutes Stage N3 and 88 minutes in Stage REM.  The percentage of Stage N1 was 3.5%, Stage N2 was 56.5%, Stage N3 was 9.4% and Stage R (REM sleep) was 30.6%.   The arousals were noted as: 32 were spontaneous, 0 were associated with PLMs, 10 were associated with respiratory events.   RESPIRATORY ANALYSIS:  There were a total of 10 respiratory events:  2 obstructive apneas, 0 central apneas and 8 hypopneas with a hypopnea index of 1.7 /hour. The patient also had 0 respiratory event related arousals (RERAs). The total APNEA/HYPOPNEA INDEX (AHI) was 2.1/hour and the total RESPIRATORY DISTURBANCE INDEX was 2.1 /hour.  10 events occurred in REM sleep and 0 events in NREM. The REM AHI was 6.8 /hour, versus a non-REM AHI of 0. The patient spent 63.5 minutes of total sleep time in the supine position and 224 minutes in non-supine. The supine AHI was 0.0 versus a non-supine AHI of 2.6.  OXYGEN SATURATION & C02:  The Wake baseline 02 saturation was 97%, with the lowest being 86%. Time spent below 89% saturation equaled 1 minute.  PERIODIC LIMB MOVEMENTS:   The patient had a total of 0 Periodic Limb Movements.  Audio and video analysis did not show any abnormal or unusual movements, behaviors,  phonations or vocalizations.  The patient took no bathroom breaks. Moderate Snoring was noted. The patient took Benadryl.  EKG was in keeping with normal sinus rhythm (NSR).  IMPRESSION: neither PLMs nor significant OSA, but snoring.  Please note high sleep efficiency on Benadryl with a high proportion of REM sleep.   RECOMMENDATIONS:  1. Positional therapy is advised as supine sleep worsened snoring. 2. Avoid sedative-hypnotics which may worsen snoring, alcohol and tobacco (as applicable). 3. Advise to lose weight,  by diet and exercise if not contraindicated  (BMI 32). 4. ENT procedure and /or dental device for snoring treatment  as clinically indicated  5. Consider dedicated sleep psychology referral if insomnia is of clinical concern.   6. A follow up appointment will be scheduled in the Sleep Clinic at [facility]. The referring provider will be notified of the results.      I certify that I have reviewed the entire raw data recording prior to the issuance of this report in accordance with the Standards of Accreditation of the American Academy of Sleep Medicine (AASM)      Larey Seat, MD  05-20-2016 Diplomat, American Board of Psychiatry and Neurology  Diplomat, American Board of [place] Director, [employer] [facility] [employer]

## 2016-05-27 ENCOUNTER — Telehealth: Payer: Self-pay

## 2016-05-27 NOTE — Telephone Encounter (Signed)
I spoke to pt. I advised her that her sleep study revealed neither PLMs or significant sleep apnea, but snoring was noted. Pt had high sleep efficiency on Benadryl with a high proportion of REM sleep. I advised pt of positional therapy since supine sleep worsened snoring. I advised pt to avoid sedative-hypnotics which may worsen sleep apnea, alcohol and tobacco. I advised pt to lose weight, by diet and exercise if not contraindicated. I advised her to consider and ENT procedure/dental device for snoring treatment as well as a sleep psychology referral if insomnia is of clinical concern. Pt says that she will think about these referrals for dentistry/ENT/psychology and will discuss with Dr. Brett Fairy at her appt in March. Pt verbalized understanding of results. Pt had no questions at this time but was encouraged to call back if questions arise.

## 2016-05-27 NOTE — Telephone Encounter (Signed)
-----   Message from Larey Seat, MD sent at 05/20/2016 12:38 PM EST ----- IMPRESSION: neither PLMs nor significant OSA, but snoring.  Please note high sleep efficiency on Benadryl with a high proportion of REM sleep.   RECOMMENDATIONS:  1. Positional therapy is advised as supine sleep worsened snoring. 2. Avoid sedative-hypnotics which may worsen snoring, alcohol and tobacco (as applicable). 3. Advise to lose weight, by diet and exercise if not contraindicated (BMI 32). 4. ENT procedure and /or dental device for snoring treatment  as clinically indicated  5. Consider dedicated sleep psychology referral if insomnia is of clinical concern.

## 2016-06-15 ENCOUNTER — Other Ambulatory Visit: Payer: Self-pay | Admitting: Neurology

## 2016-07-08 ENCOUNTER — Encounter: Payer: Self-pay | Admitting: Neurology

## 2016-07-08 ENCOUNTER — Ambulatory Visit (INDEPENDENT_AMBULATORY_CARE_PROVIDER_SITE_OTHER): Payer: Federal, State, Local not specified - PPO | Admitting: Neurology

## 2016-07-08 VITALS — BP 112/69 | HR 75 | Resp 20 | Ht 64.0 in | Wt 196.0 lb

## 2016-07-08 DIAGNOSIS — G43001 Migraine without aura, not intractable, with status migrainosus: Secondary | ICD-10-CM | POA: Diagnosis not present

## 2016-07-08 MED ORDER — TRIAMCINOLONE ACETONIDE 55 MCG/ACT NA AERO: 2.0000 | INHALATION_SPRAY | Freq: Every day | NASAL | 12 refills | Status: DC

## 2016-07-08 NOTE — Addendum Note (Signed)
Addended by: Larey Seat on: 07/08/2016 03:01 PM   Modules accepted: Orders

## 2016-07-08 NOTE — Progress Notes (Signed)
SLEEP MEDICINE CLINIC   Provider:  Larey Seat, M D  Referring Provider: Hermine Messick, MD Primary Care Physician:  Hermine Messick, MD  Chief Complaint  Patient presents with  . Follow-up    still having migraines    HPI:  Bianca Smith is a 36 y.o. female , born on [place], and seen here as a referral from Dr. Hermine Messick of [facility] and Dr Everett Graff,( OB GYN )  for an evaluation of headaches.  Chief complaint according to patient : My headaches need to be evaluated and treated.   Bianca Smith has been seeing her obstetrician in September and reported her increasing frequency and intensity of what appears to be migrainous headaches. A CT of the head was ordered after she went through an emergency room on 01/15/2016. This did not reveal an abnormality. Her headaches were described as generalized, dull in quality,  radiating to the orbit, neck and shoulder, headaches can last up to 4 days which led to her emergency room visit. She said that the onset is gradual and constant, there is no waxing and waning nature to it.She did not tolerate bright lights very well, photophobia was present, the headaches have recurred and seemed to be worsening in intensity and frequency, light and sounds dose seemed to elevate the headaches and she has associated strong nausea. 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 Tinnitus is also associated with her headaches. 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  She is a PTSD sufferer. Stress and anxiety contribute to her headaches. She has insomnia, and this further becomes a cyclic reaction, developing into headaches , anxiety , fear.  She was evaluated at the [facility] for her headaches once, diagnosed with migrainous headaches, and given Imitrex but no preventative medication.  Sleep habits are as follows: She usually  tries to go to bed by around 8 PM but is unable to initiate sleep for a long time. She has to rise early usually is up at 4 AM and she works until 2 PM. Her bedroom is described as core, quiet and dark. She lives by herself, she is an established side sleeper. She sometimes elevate the head of bed to have better sleep. Many  nights she will not have to getup at all , at the most she has one bathroom break. She reports racing thoughts and recurrent falls keeping her from falling asleep or going back to sleep. She made a connection to her PTSD, depression and anxiety, the feeling of not having enough energy, insomnia, headaches dizziness, blurred vision constipation and ringing in the ears.  Sleep medical history and family sleep history: No history of family members with obstructive sleep apnea or other sleep disorders. 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 Single. She works for the [employer].   She goes to the gym regularly, 3-5 days per week and she walks her dog every day. She hydrates very well with water. She has limited her our goal intake, she has quit drinking caffeine, she does not use tobacco products of any kind.  Interval history from 07/08/2016, I have pleasure of seeing Bianca Smith today following her  sleep study from 05/19/2016. The sleep study showed that the patient had a prolonged sleep latency of 35 minutes and a prolonged REM latency of 141 minutes sleep efficiency was 75.7% and she had very very minor apnea. There were neither periodic limb movements but snoring was observed. She states that the sleep study may not be a reliable reflection of her usual sleep habits, as she developed the flu and felt already sick with night of the study within 2 days later she was very sick. Base of the sleep study I suggested that the  patient will take Benadryl or Unisom at night to induce sleep and that I would like her to avoid taking medications that suppress breathing or work as relaxants of the upper airway, therefore increasing her snoring.  [facility]  NEUROIMAGING REPORT    STUDY DATE: 04/24/16 PATIENT NAME: Bianca Smith DOB: 1981-02-02 MRN: [id number]  ORDERING CLINICIAN: Larey Seat, MD  CLINICAL HISTORY: 36 year old female with headaches.  EXAM: MRI brain (without)  TECHNIQUE: MRI of the brain without contrast was obtained utilizing 5 mm axial slices with T1, T2, T2 flair, SWI and diffusion weighted views.  T1 sagittal and T2 coronal views were obtained. CONTRAST: no IMAGING SITE: [facility] [address] (1.5 Tesla MRI)    FINDINGS:   Metallic artifact obscures some views of brain parenchyma especially on DWI.  No abnormal lesions are seen on diffusion-weighted views to suggest acute ischemia. The cortical sulci, fissures and cisterns are normal in size and appearance. Lateral, third and fourth ventricle are normal in size and appearance. No extra-axial fluid collections are seen. No evidence of mass effect or midline shift.    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 No evidence of intracranial hemorrhage on SWI views. The orbits and their contents, paranasal sinuses and calvarium are unremarkable.  Intracranial flow voids are present.   IMPRESSION:  Unremarkable MRI brain (without). No definite acute findings. Metallic artifact obscures some views of brain parenchyma.      INTERPRETING PHYSICIAN:  VPenni Bombard MD Certified in Neurology, Neurophysiology and Neuroimaging  [facility] [address] [place] [id number]([phone]  Result Notes    Review of Systems: Out of a complete 14 system  review, the patient complains of only the following symptoms, and all other reviewed systems are negative. The patient reported an anxiety and panic attack after being treated with Fioricet with codeine ( ED [facility] ) ,  it created vivid, lifelike dreams and flashbacks, palpitations or diaphoresis. She was even more insomniac.  Epworth score  08 -24. Always tired and fatigued, dragging through the day. Headaches, nausea. Visual aura.    Social History   Social History  . Marital status: Single    Spouse name: N/A  . Number of children: N/A  . Years of education: N/A   Occupational History  . Not on file.   Social History Main Topics  . Smoking status: Never Smoker  . Smokeless tobacco: Never Used     Comment: NEVER USED TOBACCO  . Alcohol use 0.0 oz/week  . Drug use: No  . Sexual activity: Yes    Birth control/ protection: None   Other Topics Concern  . Not on file   Social History Narrative  . No narrative on file    No family history on file.  Past Medical History:  Diagnosis  Date  . Complication of anesthesia    crying  . Thrombocytosis ([place])     Past Surgical History:  Procedure Laterality Date  . BONE MARROW BIOPSY    . HYSTEROSCOPY    . IUD REMOVAL    . ROBOT ASSISTED MYOMECTOMY N/A 10/11/2015   Procedure: ROBOTIC ASSISTED MYOMECTOMY;  Surgeon: Delsa Bern, MD;  Location: [facility];  Service: Gynecology;  Laterality: N/A;  with power morcellation   . WISDOM TOOTH EXTRACTION      Current Outpatient Prescriptions  Medication Sig Dispense Refill  . linaclotide (LINZESS) 290 MCG CAPS capsule Take 290 mcg by mouth daily before breakfast.    . metoprolol succinate (TOPROL-XL) 25 MG 24 hr tablet TAKE 1 TABLET(25 MG) BY MOUTH DAILY AT NIGHT 30 tablet 0  . sertraline (ZOLOFT) 25 MG tablet Take 25 mg by mouth 2 (two) times daily.     Current Facility-Administered Medications  Medication Dose Route Frequency Provider Last Rate Last Dose  . valproate (DEPACON) 500  mg in dextrose 5 % 50 mL IVPB  500 mg Intravenous Once Larey Seat, MD        Allergies as of 07/08/2016  . (No Known Allergies)    Vitals: BP 112/69   Pulse 75   Resp 20   Ht '5\' 4"'$  (1.626 m)   Wt 196 lb (88.9 kg)   BMI 33.64 kg/m  Last Weight:  Wt Readings from Last 1 Encounters:  07/08/16 196 lb (88.9 kg)   WIO:XBDZ mass index is 33.64 kg/m.     Last Height:   Ht Readings from Last 1 Encounters:  07/08/16 '5\' 4"'$  (1.626 m)    Physical exam:  General: The patient is awake, alert and appears not in acute distress. The patient is well groomed. Head: Normocephalic, atraumatic. Neck is supple. Mallampati  3 neck circumference:15. Nasal airflow patent , wore braces.  Cardiovascular:  Regular rate and rhythm, without  murmurs or carotid bruit, and without distended neck veins. Respiratory: Lungs are clear to auscultation. Skin:  Without evidence of edema, or rash Trunk:[name] posture is erect   Neurologic exam :The patient is awake and alert, oriented to place and time.   Memory subjective described as/  appears normal. Speech is fluent, without dysarthria, dysphonia or aphasia. Mood and affect are appropriate.  Cranial nerves: She reports occasional metallic taste in her mouth. No  Change in sense of smell.  Pupils are equal and briskly reactive to light. Extraocular movements  in vertical and horizontal planes intact and without nystagmus. Visual fields by finger perimetry are intact. No facial dysesthesia. Lips have tingled.   Facial motor strength is symmetric , her tongue and uvula moved midline.  Shoulder shrug was symmetrical.   Motor exam: Normal tone, muscle bulk and symmetric strength in all extremities.   The patient was advised of the nature of the diagnosed headache  disorder , namely migraine headaches,  the treatment options and risks for general a health and wellness arising from not treating the condition were discussed, the side effects. The  correlation to sleep apnea.  I spent more than 20 minutes of face to face time with the patient. Greater than 50% of time was spent in counseling and coordination of care. We have discussed the diagnosis and differential and I answered the patient's questions.     Assessment:  After physical and neurologic examination, review of laboratory studies,  Personal review of imaging studies, reports of other /same  Imaging studies ,  Results of polysomnography/ neurophysiology testing and pre-existing records as far as provided in visit., my assessment is   1) Bianca Smith suffers clearly from a migrainous type headache associated with nausea, photophobia and a visual aura. She reports that her speech changes and that she has trouble finding words. She is also not as alert or able to orient herself. She has recently noticed an additional symptom of tinnitus. MRI was negative   2) Vertigo- Tinnitus has improved but is not alleviated by her current medication regimen, her headaches have become less intense but not necessarily less frequent. She has noted some dizziness on preventive medication, little bit of lightheadedness, and she does feel off balance. rports orthostatic dizziness and vertigo- the room feels as if spinning around her.   3)She has PTSD, a condition that predisposes her to poor sleep, vivid dreams and patients with this condition should not be prescribed narcotic pain medication as this increases the chance of nocturnal hallucinations poor sleep, anxiety and panic attacks. She feels that she cannot sleep longer than 2 or 3 hours en bloc, she will get a total sleep time of 4 or 5 hours maximum, but not in one sleep period. I asked her not to take Fioricet and she is actually not inclined to do so anyway.  4) Bianca Smith clearly has no sleep apnea, and even if she would have suffered a severe head cold or the flu I would have expected her to present with higher apneas based on that illness. I would  recommend for her to use Unisom more Benadryl  ( OTC) avoid the supine sleep position. I do not think that her headache disorder is it any way related to sleep disordered breathing. Tennis ball methode explained for behavior / positional modification.  5) She has a visit scheduled with Dr. Pearline Cables , she wants to have a baby before her 65 Birthday.  Her partner is sharing this vision with her.    Plan:  Treatment plan and additional workup :  She has improved in terms of headache intensity but still has frequent headaches and she has more days with and without headaches. 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 Toprol 25 mg in a 24-hour release form has been used, but it has given her slight orthostatic dizziness.  For that reason I asked her to try a half tablet, and only at night - knowing that it will no longer be extended release it may still prevent her headaches to the same degree without coughing daytime dizziness. A beta blocker is usually safe in pregnancy. Topiramate should not be initiated at this time, and certainly we will not use Imitrex or similar medications that have vasoconstrictive potential in a young woman that is planning a pregnancy.  She is allowed to take ibuprofen or Tylenol in pregnancy as well as now.   Siedah Sedor, MD     Everett Graff, MD

## 2016-07-08 NOTE — Patient Instructions (Signed)

## 2016-08-29 ENCOUNTER — Other Ambulatory Visit: Payer: Self-pay | Admitting: Neurology

## 2016-09-10 ENCOUNTER — Telehealth: Payer: Self-pay | Admitting: *Deleted

## 2016-09-10 NOTE — Telephone Encounter (Signed)
Pt form @ front desk for pick up.

## 2016-09-11 DIAGNOSIS — Z0289 Encounter for other administrative examinations: Secondary | ICD-10-CM

## 2016-09-12 NOTE — Telephone Encounter (Signed)
Pt dropped off the form again for the [employer] regarding headaches that was filled out for her on 09/02/2016. She needs the following statement added to the form or in a letter. I spoke to Dr. Brett Fairy, and she is agreeable to this letter being written. Letter penned, will wait for Dr. Brett Fairy to sign it.

## 2016-09-12 NOTE — Telephone Encounter (Signed)
Dr. Brett Fairy signed the letter. A copy of this signed letter was sent to [facility] to be scanned into EP[employer].  I called pt and advised her that her form and letter are ready for pick up at the front desk. I gave clinic hours. Pt verbalized understanding.

## 2016-10-03 ENCOUNTER — Other Ambulatory Visit: Payer: Self-pay | Admitting: Neurology

## 2016-10-16 ENCOUNTER — Other Ambulatory Visit: Payer: Self-pay | Admitting: Obstetrics and Gynecology

## 2016-10-16 DIAGNOSIS — N63 Unspecified lump in unspecified breast: Secondary | ICD-10-CM

## 2016-10-30 ENCOUNTER — Other Ambulatory Visit: Payer: Self-pay | Admitting: Obstetrics and Gynecology

## 2016-10-30 ENCOUNTER — Ambulatory Visit
Admission: RE | Admit: 2016-10-30 | Discharge: 2016-10-30 | Disposition: A | Discharge status: Home / Self Care | Payer: Federal, State, Local not specified - PPO | Source: Ambulatory Visit | Attending: Obstetrics and Gynecology | Admitting: Obstetrics and Gynecology

## 2016-10-30 DIAGNOSIS — N63 Unspecified lump in unspecified breast: Secondary | ICD-10-CM

## 2016-11-07 ENCOUNTER — Ambulatory Visit (INDEPENDENT_AMBULATORY_CARE_PROVIDER_SITE_OTHER): Payer: Federal, State, Local not specified - PPO | Admitting: Adult Health

## 2016-11-07 ENCOUNTER — Encounter: Payer: Self-pay | Admitting: Adult Health

## 2016-11-07 VITALS — BP 113/79 | HR 74 | Ht 64.0 in | Wt 197.0 lb

## 2016-11-07 DIAGNOSIS — Z789 Other specified health status: Secondary | ICD-10-CM

## 2016-11-07 DIAGNOSIS — G43009 Migraine without aura, not intractable, without status migrainosus: Secondary | ICD-10-CM | POA: Diagnosis not present

## 2016-11-07 NOTE — Patient Instructions (Signed)
Your Plan:  Continue Toprol XL 25 mg  If your symptoms worsen or you develop new symptoms please let us know.   Thank you for coming to see [place] at [facility]. I hope we have been able to provide you high quality care today.  You may receive a patient satisfaction survey over the next few weeks. We would appreciate your feedback and comments so that we may continue to improve ourselves and the health of our patients.

## 2016-11-07 NOTE — Progress Notes (Signed)
I agree with the assessment and plan as directed by NP .The patient is known to me .   Samary Shatz, MD  

## 2016-11-07 NOTE — Progress Notes (Signed)
PATIENT: Bianca Smith DOB: Aug 25, 1980  REASON FOR VISIT: follow up- migraine HISTORY FROM: patient  HISTORY OF PRESENT ILLNESS: 11/07/16 Bianca Smith is a 36 year old female with a history of migraine. She returns today for follow-up. She is currently taking toprol 25 mg daily. She reports that her her headache frequency has improved. She reports that she has approximately 3 headaches a month. However when she does get a headache it can last 2-3 days. Her headaches always occur on the right side. She also has a ringing in the year on the right. She does have photophobia, phonophobia, nausea and vomiting. She states when she gets the headache she will take Excedrin Migraine. She is currently trying to get pregnant. This month she will be trying artificial insemination. At this time she states that she does not want to initiate any new medication until after she sees how this procedure goes. She returns today for an evaluation.  HISTORY 07/08/16: Bianca Smith has been seeing her obstetrician in September and reported her increasing frequency and intensity of what appears to be migrainous headaches. A CT of the head was ordered after she went through an emergency room on 01/15/2016. This did not reveal an abnormality. Her headaches were described as generalized, dull in quality,  radiating to the orbit, neck and shoulder, headaches can last up to 4 days which led to her emergency room visit. She said that the onset is gradual and constant, there is no waxing and waning nature to it.She did not tolerate bright lights very well, photophobia was present, the headaches have recurred and seemed to be worsening in intensity and frequency, light and sounds dose seemed to elevate the headaches and she has associated strong nausea. 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 Tinnitus is also associated with her  headaches. 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  She is a PTSD sufferer. Stress and anxiety contribute to her headaches. She has insomnia, and this further becomes a cyclic reaction, developing into headaches , anxiety , fear.  She was evaluated at the [facility] for her headaches once, diagnosed with migrainous headaches, and given Imitrex but no preventative medication.  Sleep habits are as follows: She usually tries to go to bed by around 8 PM but is unable to initiate sleep for a long time. She has to rise early usually is up at 4 AM and she works until 2 PM. Her bedroom is described as core, quiet and dark. She lives by herself, she is an established side sleeper. She sometimes elevate the head of bed to have better sleep. Many  nights she will not have to getup at all , at the most she has one bathroom break. She reports racing thoughts and recurrent falls keeping her from falling asleep or going back to sleep. She made a connection to her PTSD, depression and anxiety, the feeling of not having enough energy, insomnia, headaches dizziness, blurred vision constipation and ringing in the ears.  Sleep medical history and family sleep history: No history of family members with obstructive sleep apnea or other sleep disorders. 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 Single.  She works for the [employer].   She goes to the gym regularly, 3-5 days per week and she walks her dog every day. She hydrates very well with water. She has limited her our goal intake, she has quit drinking caffeine, she does not use tobacco products of any kind.  Interval history from 07/08/2016, I have pleasure of seeing Bianca Smith today following her sleep study from 05/19/2016. The sleep study showed that the patient had a  prolonged sleep latency of 35 minutes and a prolonged REM latency of 141 minutes sleep efficiency was 75.7% and she had very very minor apnea. There were neither periodic limb movements but snoring was observed. She states that the sleep study may not be a reliable reflection of her usual sleep habits, as she developed the flu and felt already sick with night of the study within 2 days later she was very sick. Base of the sleep study I suggested that the patient will take Benadryl or Unisom at night to induce sleep and that I would like her to avoid taking medications that suppress breathing or work as relaxants of the upper airway, therefore increasing her snoring.  REVIEW OF SYSTEMS: Out of a complete 14 system review of symptoms, the patient complains only of the following symptoms, and all other reviewed systems are negative.  Insomnia, snoring, dizziness, headache, light sensitivity, ear pain, ringing in ears fatigue  ALLERGIES: No Known Allergies  HOME MEDICATIONS: Outpatient Medications Prior to Visit  Medication Sig Dispense Refill  . linaclotide (LINZESS) 290 MCG CAPS capsule Take 290 mcg by mouth daily before breakfast.    . metoprolol succinate (TOPROL-XL) 25 MG 24 hr tablet TAKE 1 TABLET(25 MG) BY MOUTH DAILY AT NIGHT 30 tablet 2  . sertraline (ZOLOFT) 25 MG tablet Take 25 mg by mouth 2 (two) times daily.    Marland Kitchen triamcinolone (NASACORT ALLERGY 24HR) 55 MCG/ACT AERO nasal inhaler Place 2 sprays into the nose daily. (Patient not taking: Reported on 11/07/2016) 1 Inhaler 12   Facility-Administered Medications Prior to Visit  Medication Dose Route Frequency Provider Last Rate Last Dose  . valproate (DEPACON) 500 mg in dextrose 5 % 50 mL IVPB  500 mg Intravenous Once Dohmeier, Asencion Partridge, MD        PAST MEDICAL HISTORY: Past Medical History:  Diagnosis Date  . Complication of anesthesia    crying  . Thrombocytosis ([place])     PAST SURGICAL HISTORY: Past Surgical History:  Procedure  Laterality Date  . BONE MARROW BIOPSY    . HYSTEROSCOPY    . IUD REMOVAL    . ROBOT ASSISTED MYOMECTOMY N/A 10/11/2015   Procedure: ROBOTIC ASSISTED MYOMECTOMY;  Surgeon: Delsa Bern, MD;  Location: [facility];  Service: Gynecology;  Laterality: N/A;  with power morcellation   . WISDOM TOOTH EXTRACTION      FAMILY HISTORY: No family history on file.  SOCIAL HISTORY: Social History   Social History  . Marital status: Single    Spouse name: N/A  . Number of children: N/A  . Years of education: N/A   Occupational History  . Not on file.   Social History Main Topics  . Smoking status: Never Smoker  . Smokeless tobacco: Never Used     Comment: NEVER USED TOBACCO  . Alcohol use 0.0 oz/week  . Drug use: No  . Sexual activity: Yes    Birth control/ protection: None   Other Topics Concern  . Not on file   Social History Narrative  .  No narrative on file      PHYSICAL EXAM  Vitals:   11/07/16 1445  BP: 113/79  Pulse: 74  Weight: 197 lb (89.4 kg)  Height: 5' 4" (1.626 m)   Body mass index is 33.81 kg/m.  Generalized: Well developed, in no acute distress   Neurological examination  Mentation: Alert oriented to time, place, history taking. Follows all commands speech and language fluent Cranial nerve II-XII: Pupils were equal round reactive to light. Extraocular movements were full, visual field were full on confrontational test. Facial sensation and strength were normal. Uvula tongue midline. Head turning and shoulder shrug  were normal and symmetric. Motor: The motor testing reveals 5 over 5 strength of all 4 extremities. Good symmetric motor tone is noted throughout.  Sensory: Sensory testing is intact to soft touch on all 4 extremities. No evidence of extinction is noted.  Coordination: Cerebellar testing reveals good finger-nose-finger and heel-to-shin bilaterally.  Gait and station: Gait is normal. Tandem gait is normal. Romberg is negative. No drift is seen.   Reflexes: Deep tendon reflexes are symmetric and normal bilaterally.   DIAGNOSTIC DATA (LABS, IMAGING, TESTING) - I reviewed patient records, labs, notes, testing and imaging myself where available.  Lab Results  Component Value Date   WBC 8.3 09/28/2015   HGB 13.5 09/28/2015   HCT 39.9 09/28/2015   MCV 79.0 09/28/2015   PLT 558 (H) 09/28/2015      Component Value Date/Time   NA 140 04/08/2016 1621   K 4.3 04/08/2016 1621   CL 102 04/08/2016 1621   CO2 25 04/08/2016 1621   GLUCOSE 76 04/08/2016 1621   GLUCOSE 98 09/28/2015 1630   BUN 11 04/08/2016 1621   CREATININE 0.87 04/08/2016 1621   CALCIUM 9.3 04/08/2016 1621   PROT 7.0 04/08/2016 1621   ALBUMIN 4.1 04/08/2016 1621   AST 14 04/08/2016 1621   ALT 14 04/08/2016 1621   ALKPHOS 59 04/08/2016 1621   BILITOT 0.2 04/08/2016 1621   GFRNONAA 85 04/08/2016 1621   GFRAA 98 04/08/2016 1621    ASSESSMENT AND PLAN 36 y.o. year old female  has a past medical history of Complication of anesthesia and Thrombocytosis ([place]). here with:  1. Migraines 2. Trying to conceive  The patient will continue on Toprol XL 25 mg daily. At this time she does not want to initiate any new medication. She is advised to continue to monitor her headaches. If her headaches increase in severity or frequency she should let us know. She will follow-up in 6 months or sooner if needed.    Ward Givens, MSN, NP-C 11/07/2016, 3:10 PM [facility] [address], [place], [place] [address] [phone]

## 2017-04-08 ENCOUNTER — Encounter: Payer: Self-pay | Admitting: Neurology

## 2017-04-08 ENCOUNTER — Ambulatory Visit: Payer: Federal, State, Local not specified - PPO | Admitting: Neurology

## 2017-04-08 VITALS — BP 118/78 | HR 84 | Ht 64.0 in | Wt 213.0 lb

## 2017-04-08 DIAGNOSIS — G43011 Migraine without aura, intractable, with status migrainosus: Secondary | ICD-10-CM | POA: Diagnosis not present

## 2017-04-08 DIAGNOSIS — T50905A Adverse effect of unspecified drugs, medicaments and biological substances, initial encounter: Secondary | ICD-10-CM | POA: Insufficient documentation

## 2017-04-08 DIAGNOSIS — T50905D Adverse effect of unspecified drugs, medicaments and biological substances, subsequent encounter: Secondary | ICD-10-CM

## 2017-04-08 NOTE — Patient Instructions (Signed)
Please remember to try to maintain good sleep hygiene, which means: Keep a regular sleep and wake schedule, try not to exercise or have a meal within 2 hours of your bedtime, try to keep your bedroom conducive for sleep, that is, cool and dark, without light distractors such as an illuminated alarm clock, and refrain from watching TV right before sleep or in the middle of the night and do not keep the TV or radio on during the night. Also, try not to use or play on electronic devices at bedtime, such as your cell phone, tablet PC or laptop. If you like to read at bedtime on an electronic device, try to dim the background light as much as possible. Do not eat in the middle of the night.   Try to exercise.

## 2017-04-08 NOTE — Progress Notes (Signed)
SLEEP MEDICINE CLINIC   Provider:  Larey Smith, M D  Referring Provider: Hermine Messick, MD Primary Care Physician:  Bianca Messick, MD  Chief Complaint  Patient presents with  . Follow-up    pt alone, states that migraines have worsened. pt was stopping medication due to going forward with fertility treatment. Since coming off the medication its worsened.     HPI:  Bianca Smith is a 36 y.o. female , born on [place], and seen here as a referral from Dr. Hermine Smith of [facility] and Dr Bianca Smith,( OB GYN )  for an evaluation of headaches.  Chief complaint according to patient : "My headaches need to be evaluated and treated."  " I am treated for infertility"  Bianca Smith has been seeing her obstetrician in September and reported her increasing frequency and intensity of what appears to be migrainous headaches. A CT of the head was ordered after she went through an emergency room on 01/15/2016. This did not reveal an abnormality. Her headaches were described as generalized, dull in quality,  radiating to the orbit, neck and shoulder, headaches can last up to 4 days which led to her emergency room visit.  She said that the onset is gradual and constant, there is no waxing and waning nature to it.She did not tolerate bright lights very well, photophobia was present, the headaches have recurred and seemed to be worsening in intensity and frequency, light and sounds dose seemed to elevate the headaches and she has associated strong nausea. 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 Tinnitus is also associated with her headaches. 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  She is a PTSD sufferer. Stress and anxiety contribute to her headaches. She has insomnia, and this further becomes a cyclic reaction, developing into headaches , anxiety , fear.  She  was evaluated at the [facility] for her headaches once, diagnosed with migrainous headaches, and given Imitrex but no preventative medication.  Sleep habits are as follows: She usually tries to go to bed by around 8 PM but is unable to initiate sleep for a long time. She has to rise early usually is up at 4 AM and she works until 2 PM. Her bedroom is described as core, quiet and dark. She lives by herself, she is an established side sleeper. She sometimes elevate the head of bed to have better sleep. Many  nights she will not have to getup at all , at the most she has one bathroom break. She reports racing thoughts and recurrent falls keeping her from falling asleep or going back to sleep. She made a connection to her PTSD, depression and anxiety, the feeling of not having enough energy, insomnia, headaches dizziness, blurred vision constipation and ringing in the ears.  Sleep medical history and family sleep history: No history of family members with obstructive sleep apnea or other sleep disorders. 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 Single. She works for the [employer]. She goes to the gym regularly,3-5 days per week and she walks her dog every day. She hydrates very well with water. She has limited her our goal intake, she has quit  drinking caffeine, she does not use tobacco products of any kind.  Interval history from 07/08/2016, I have pleasure of seeing Bianca Smith today following her sleep study from 05/19/2016. The sleep study showed that the patient had a prolonged sleep latency of 35 minutes and a prolonged REM latency of 141 minutes sleep efficiency was 75.7% and she had very very minor apnea. There were neither periodic limb movements but snoring was observed. She states that the sleep study may not be a reliable reflection of her  usual sleep habits, as she developed the flu and felt already sick with night of the study within 2 days later she was very sick.   to prepare the sleep study I suggested that the patient will take Benadryl or Unisom at night to induce sleep and that I would like her to avoid taking medications that suppress breathing or work as relaxants of the upper airway, therefore increasing her snoring.   04-08-2017 ,  The patient is followed by Dr. Kerin Perna for fertility treatments, and was advised by him to hold Tylenol.  The Femara and the fertility trigger shots are worsening her headache- all this  this has made the headaches worse. He also reports a lot of nausea with her headaches, autophobia, phonophobia, episodic dizziness are associated with them.  She has started to work from home not to Bianca hours.she is too tired to work out.     [facility]  NEUROIMAGING REPORT    STUDY DATE: 04/24/16 PATIENT NAME: Bianca Smith DOB: August 04, 1980 MRN: [id number]  ORDERING CLINICIAN: Larey Seat, MD  CLINICAL HISTORY: 36 year old female with headaches.  EXAM: MRI brain (without)  TECHNIQUE: MRI of the brain without contrast was obtained utilizing 5 mm axial slices with T1, T2, T2 flair, SWI and diffusion weighted views.  T1 sagittal and T2 coronal views were obtained. CONTRAST: no IMAGING SITE: [facility] [address] (1.5 Tesla MRI)    FINDINGS:   Metallic artifact obscures some views of brain parenchyma especially on DWI.  No abnormal lesions are seen on diffusion-weighted views to suggest acute ischemia. The cortical sulci, fissures and cisterns are normal in size and appearance. Lateral, third and fourth ventricle are normal in size and appearance. No extra-axial fluid collections are seen. No evidence of mass effect or midline shift.    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 No evidence of intracranial hemorrhage on SWI views. The orbits and their contents, paranasal sinuses and calvarium are unremarkable.  Intracranial flow voids are present.   IMPRESSION:  Unremarkable MRI brain (without). No definite acute findings. Metallic artifact obscures some views of brain parenchyma.      INTERPRETING PHYSICIAN:  VPenni Bombard MD Certified in Neurology, Neurophysiology and Neuroimaging  [facility] [address] [place] [phone]  Result Notes    Review of Systems: Out of a complete 14 system review, the patient complains of only the following symptoms, and all other reviewed systems are negative. The patient reported an anxiety and panic attack after being treated with Fioricet with codeine ( ED [facility] ) ,  it created vivid, lifelike dreams and flashbacks, palpitations or diaphoresis. She was even more insomniac.  Epworth score  08 -24. Always tired and fatigued, dragging through the day. Headaches, nausea. Visual aura.    Social History   Socioeconomic History  . Marital status: Single  Spouse name: Not on file  . Number of children: Not on file  . Years of education: Not on file  . Highest education level: Not on file  Social Needs  . Financial resource strain: Not on file  . Food insecurity - worry: Not on file  . Food insecurity - inability: Not on file  . Transportation needs - medical: Not on file  . Transportation needs - non-medical: Not on file  Occupational History  . Not on file  Tobacco Use  . Smoking status: Never Smoker  . Smokeless tobacco: Never Used  . Tobacco comment: NEVER USED TOBACCO  Substance and Sexual Activity  . Alcohol use: Yes    Alcohol/week: 0.0 oz  . Drug use: No  . Sexual activity: Yes    Birth control/protection: None  Other Topics Concern  . Not on file  Social History Narrative  . Not on file    No  family history on file.  Past Medical History:  Diagnosis Date  . Complication of anesthesia    crying  . Thrombocytosis ([place])     Past Surgical History:  Procedure Laterality Date  . BONE MARROW BIOPSY    . HYSTEROSCOPY    . IUD REMOVAL    . ROBOT ASSISTED MYOMECTOMY N/A 10/11/2015   Procedure: ROBOTIC ASSISTED MYOMECTOMY;  Surgeon: Delsa Bern, MD;  Location: [facility];  Service: Gynecology;  Laterality: N/A;  with power morcellation   . WISDOM TOOTH EXTRACTION      Current Outpatient Medications  Medication Sig Dispense Refill  . linaclotide (LINZESS) 290 MCG CAPS capsule Take 290 mcg by mouth daily before breakfast.    . metoprolol succinate (TOPROL-XL) 25 MG 24 hr tablet TAKE 1 TABLET(25 MG) BY MOUTH DAILY AT NIGHT 30 tablet 2  . sertraline (ZOLOFT) 25 MG tablet Take 25 mg by mouth 2 (two) times daily.     Current Facility-Administered Medications  Medication Dose Route Frequency Provider Last Rate Last Dose  . valproate (DEPACON) 500 mg in dextrose 5 % 50 mL IVPB  500 mg Intravenous Once Kazzandra Desaulniers, Asencion Partridge, MD        Allergies as of 04/08/2017  . (No Known Allergies)    Vitals: BP 118/78   Pulse 84   Ht '5\' 4"'$  (1.626 m)   Wt 213 lb (96.6 kg)   BMI 36.56 kg/m  Last Weight:  Wt Readings from Last 1 Encounters:  04/08/17 213 lb (96.6 kg)   GUY:QIHK mass index is 36.56 kg/m.     Last Height:   Ht Readings from Last 1 Encounters:  04/08/17 '5\' 4"'$  (1.626 m)    Physical exam:  General: The patient is awake, alert and appears not in acute distress. The patient is well groomed. Head: Normocephalic, atraumatic. Neck is supple. Mallampati  3 neck circumference:15. Nasal airflow patent , wore braces.  Cardiovascular:  Regular rate and rhythm, without  murmurs or carotid bruit, and without distended neck veins. Respiratory: Lungs are clear to auscultation. Skin:  Without evidence of edema, or rash Trunk:[name] posture is erect  Neurologic exam :The patient is awake  and alert, oriented to place and time.   Memory subjective described as/  appears normal. Speech is fluent, without dysarthria, dysphonia or aphasia. Mood and affect are appropriate. Cranial nerves: She reports occasional metallic taste in her mouth, has improved . No change in sense of smell.  Pupils are equal and briskly reactive to light. Extraocular movements  in vertical and horizontal planes intact and  without nystagmus. Visual fields by finger perimetry are intact.No facial dysesthesia. Lips have tingled.   Facial motor strength is symmetric , her tongue and uvula moved midline. Shoulder shrug was symmetrical.  Motor exam: Normal tone, muscle bulk and symmetric strength in all extremities.   The patient was advised of the nature of the diagnosed headache  disorder , namely migraine headaches,  the treatment options and risks for general a health and wellness arising from not treating the condition were discussed, the side effects. The correlation to sleep apnea.  I spent more than 20 minutes of face to face time with the patient. Greater than 50% of time was spent in counseling and coordination of care. We have discussed the diagnosis and differential and I answered the patient's questions.     Assessment:  After physical and neurologic examination, review of laboratory studies,  Personal review of imaging studies, reports of other /same  Imaging studies ,  Results of polysomnography/ neurophysiology testing and pre-existing records as far as provided in visit., my assessment is   1) Bianca Smith suffers clearly from a migrainous type headache associated with nausea, photophobia and a visual aura. She reports that her speech changes and that she has trouble finding words. She is also not as alert or able to orient herself. She has recently noticed an additional symptom of tinnitus. MRI was negative -  She has experienced worsening headaches with the beginning of fertility treatment. She produced  2-3 eggs per cycle.   2) Vertigo- Tinnitus has improved but is not alleviated by her current medication regimen, her headaches have become less intense but not necessarily less frequent. She has noted some dizziness on preventive medication, little bit of lightheadedness, and she does feel off balance. reports orthostatic dizziness and vertigo- the room feels as if spinning around her. This has not improved and is strongly associated with her migraines.   3) She has PTSD, a condition that predisposes her to poor sleep, vivid dreams and patients with this condition should not be prescribed narcotic pain medication as this increases the chance of nocturnal hallucinations poor sleep, anxiety and panic attacks.  She snores more - she had no sleep apnea.  She feels that she cannot sleep longer than 2 or 3 hours en bloc, she will get a total sleep time of 4 or 5 hours maximum, but not in one sleep period. I asked her not to take Fioricet and she is actually not inclined to do so anyway. I prefer to treat her snoring with a dental device -   4)  She has a visit scheduled with Dr. Pearline Cables , she wants to have a baby before her 54 Birthday.  Her partner is sharing this vision with her.    Plan:  Treatment plan and additional workup :  She has improved in terms of headache intensity but still has frequent headaches and she has more days with and without headaches. 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  She will follow with lifestyle adjustments rather than medications. I hope she finds relief enough to be encouraged to continue fertility treatment.     Bianca Seat, MD    Bianca Smith, M.D.

## 2017-05-08 ENCOUNTER — Ambulatory Visit
Admission: RE | Admit: 2017-05-08 | Discharge: 2017-05-08 | Disposition: A | Discharge status: Home / Self Care | Payer: Federal, State, Local not specified - PPO | Source: Ambulatory Visit | Attending: Obstetrics and Gynecology | Admitting: Obstetrics and Gynecology

## 2017-05-08 DIAGNOSIS — N63 Unspecified lump in unspecified breast: Secondary | ICD-10-CM

## 2017-05-22 ENCOUNTER — Ambulatory Visit: Payer: Federal, State, Local not specified - PPO | Admitting: Adult Health

## 2017-06-02 ENCOUNTER — Encounter: Payer: Self-pay | Admitting: Rehabilitative and Restorative Service Providers"

## 2017-06-02 ENCOUNTER — Ambulatory Visit: Payer: Federal, State, Local not specified - PPO | Admitting: Rehabilitative and Restorative Service Providers"

## 2017-06-02 DIAGNOSIS — R29898 Other symptoms and signs involving the musculoskeletal system: Secondary | ICD-10-CM | POA: Diagnosis not present

## 2017-06-02 DIAGNOSIS — M25571 Pain in right ankle and joints of right foot: Secondary | ICD-10-CM

## 2017-06-02 NOTE — Therapy (Signed)
[facility] [place], [place], [address] Phone: [phone]   Fax:  [phone]  Physical Therapy Evaluation  Patient Details  Name: Bianca Smith MRN: [id number] Date of Birth: 04-26-1981 Referring Provider: Mechele Claude PA-C    Encounter Date: 06/02/2017  PT End of Session - 06/02/17 1714    Visit Number  1    Number of Visits  12    Date for PT Re-Evaluation  07/14/17    PT Start Time  9678    PT Stop Time  1657    PT Time Calculation (min)  50 min    Activity Tolerance  Patient tolerated treatment well       Past Medical History:  Diagnosis Date  . Complication of anesthesia    crying  . Thrombocytosis ([place])     Past Surgical History:  Procedure Laterality Date  . BONE MARROW BIOPSY    . HYSTEROSCOPY    . IUD REMOVAL    . ROBOT ASSISTED MYOMECTOMY N/A 10/11/2015   Procedure: ROBOTIC ASSISTED MYOMECTOMY;  Surgeon: Delsa Bern, MD;  Location: [facility];  Service: Gynecology;  Laterality: N/A;  with power morcellation   . WISDOM TOOTH EXTRACTION      There were no vitals filed for this visit.   Subjective Assessment - 06/02/17 1615    Subjective  Patient reports that she has had pain in the Rt foot and ankle for the past year wit hno known injury. Symptoms have gradually increased in the past year. She now has pain with any functional activitiy.     How long can you stand comfortably?  5-10 min     How long can you walk comfortably?  1-2 min     Diagnostic tests  MRI     Patient Stated Goals  get rid of the Rt foot and leg pain     Currently in Pain?  Yes    Pain Score  6     Pain Location  Foot    Pain Orientation  Right    Pain Descriptors / Indicators  Burning;Throbbing;Aching;Pins and needles    Pain Radiating Towards  up into medial calf to knee and around the Rt ankle     Pain Onset  More than a month ago    Pain Frequency  Constant    Aggravating Factors   prolonged standing; walking;  propped foot; weight bearing    Pain Relieving Factors  voltaren gel; staying off feet          [facility] PT Assessment - 06/02/17 0001      Assessment   Medical Diagnosis  Rt tibialis posterior tendinitis     Referring Provider  Mechele Claude PA-C     Onset Date/Surgical Date  05/06/16    Hand Dominance  Right    Next MD Visit  06/09/17    Prior Therapy  none       Precautions   Precautions  None      Restrictions   Weight Bearing Restrictions  No      Balance Screen   Has the patient fallen in the past 6 months  No    Has the patient had a decrease in activity level because of a fear of falling?   No    Is the patient reluctant to leave their home because of a fear of falling?   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     Leisure  household chores; walking ~3 days/wk for 30-60 min - not walking since foot and leg pain started       Observation/Other Assessments   Focus on Therapeutic Outcomes (FOTO)   52% limitation       Sensation   Additional Comments  sensation WFL's per pt report - note Rt LE skin shiny compared to Lt - knee to foot      Posture/Postural Control   Posture Comments  wt shifted to Lt slightly; LE's slightly externally rotated; decreased longitudinal arch with pronated feet Rt > Lt       AROM   Right/Left Hip  -- tight end ranges bilat Rt > Lt     Right/Left Knee  -- WNL's bilat     Right/Left Ankle  -- pain w/ all Rt ankle mvts > w/ DF and ever     Right Ankle Dorsiflexion  -9    Right Ankle Plantar Flexion  44    Right Ankle Inversion  26    Right Ankle Eversion  10    Left Ankle Dorsiflexion  4    Left Ankle Plantar Flexion  53    Left Ankle Inversion  31    Left Ankle Eversion  26      Strength   Right/Left Hip  -- grossly  5/5 bilat - Rt LE panful with resisted testing     Right/Left Knee  -- Rt knee painful with resistive testing     Right Knee Flexion  4+/5    Right Knee Extension  4+/5    Left Knee Flexion  5/5    Left Knee Extension  5/5    Right/Left Ankle  -- unable to assessRt strength d/t pain; moving against gravity    Left Ankle Dorsiflexion  5/5    Left Ankle Plantar Flexion  4+/5    Left Ankle Inversion  5/5    Left Ankle Eversion  5/5      Flexibility   Hamstrings  tigth Rt 75 deg; Lt 85 deg    Quadriceps  tight Rt     ITB  tight Rt    Piriformis  tight Rt       Palpation   Palpation comment  Rt hip to foot tight and tender to palpation; Rt leg tight; tender to palpation posterior medial malleolus at insertion of tibialis posterior and along path of posterior tib on Rt              Objective measurements completed on examination: See above findings.      [facility] PT Treatment/Exercise - 06/02/17 0001      Knee/Hip Exercises: Stretches   Passive Hamstring Stretch  Right;2 reps;30 seconds supine with strap     Quad Stretch  Right;2 reps;30 seconds prone with strap     Hip Flexor Stretch  Right;2 reps;30 seconds supine bilat knees to chest; Rt LE off table     ITB Stretch  Right;1 rep;30 seconds supine with strap     Piriformis Stretch  Right;3 reps;30 seconds supine travell - varying angles       Iontophoresis   Type of Iontophoresis  Dexamethasone    Location  Rt poet tib at medial malleolus  Dose  120 mAmp    Time  10-12 hours       Ankle Exercises: Seated   ABC's  1 rep    Ankle Circles/Pumps  AROM;Right;Left;10 reps AROM - DF/PF; Inv/Ever; circles; A-Z - LE elevated              PT Education - 06/02/17 1648    Education provided  Yes    Education Details  HEP ionto     Person(s) Educated  Patient    Methods  Explanation;Demonstration;Tactile cues;Verbal cues;Handout    Comprehension  Verbalized understanding;Returned demonstration;Verbal cues  required;Tactile cues required          PT Long Term Goals - 06/02/17 1719      PT LONG TERM GOAL #1   Title  Increase Rt LE moblity and ROM to equal or greater than Lt LE 07/14/17    Time  6    Period  Weeks    Status  New      PT LONG TERM GOAL #2   Title  Improve strength Rt LE to 4+/5 or greater Rt ankle 07/14/17    Time  6    Period  Weeks    Status  New      PT LONG TERM GOAL #3   Title  Decrease pain allowing patient to stand and walk for functional times of 10-15 min with minimal to no pain Rt LE 07/14/17    Time  6    Period  Weeks    Status  New      PT LONG TERM GOAL #4   Title  Independent in HEP 07/14/17    Time  6    Period  Weeks    Status  New      PT LONG TERM GOAL #5   Title  Improve FOTO to </= 36% limitation 07/14/17    Time  6    Period  Weeks    Status  New             Plan - 06/02/17 1715    Clinical Impression Statement  Bianca Smith presents with history of chronic Rt foot, ankle and LE pain with symptoms gradually increasing over the past year. She has poor posture and alignment with pronated feet Rt > Lt; limited Rt LE mobilty and ROM; pain with palpation through the Rt posterior tib muscle origin to insertion; abnormal gait pattern; limited functional activity and pain with any standing or walking. Patient will benefit from PT to address problems identified.     Clinical Presentation  Stable    Clinical Decision Making  Low    Rehab Potential  Good    PT Frequency  2x / week    PT Duration  6 weeks    PT Treatment/Interventions  Patient/family education;ADLs/Self Care Home Management;Cryotherapy;Electrical Stimulation;Iontophoresis '4mg'$ /ml Dexamethasone;Moist Heat;Ultrasound;Dry needling;Manual techniques;Neuromuscular re-education;Gait training;Therapeutic activities;Therapeutic exercise;Taping    PT Next Visit Plan  review HEP; add manual work and modalities as indicated; assess response to exercise and gradually add exercise with focus on ROM  and active movement as tendintis and LE inflammation subsides - then gradually add strengthening as indicated; assess response to ionto and continue with antiinflammatory measures including ionto; consider taping     Consulted and Agree with Plan of Care  Patient       Patient will benefit from skilled therapeutic intervention in order to improve the following deficits and impairments:  Postural dysfunction, Improper body mechanics, Pain, Abnormal gait, Increased fascial  restricitons, Increased muscle spasms, Decreased mobility, Decreased range of motion, Decreased strength, Decreased activity tolerance  Visit Diagnosis: Pain in right ankle and joints of right foot - Plan: PT plan of care cert/re-cert  Other symptoms and signs involving the musculoskeletal system - Plan: PT plan of care cert/re-cert     Problem List Patient Active Problem List   Diagnosis Date Noted  . Drug reaction 04/08/2017  . Migraine without aura and with status migrainosus, not intractable 07/08/2016  . Morbid obesity ([place]) 04/08/2016  . Sleep related headaches 04/08/2016  . Snoring 04/08/2016  . OSA (obstructive sleep apnea) 04/08/2016  . Intractable chronic migraine without aura and with status migrainosus 04/08/2016  . Persistent headaches 04/08/2016  . Fibroid, uterine 10/11/2015    Jakorian Marengo Nilda Simmer PT, MPH  06/02/2017, 5:24 PM  [facility] [place], [place], [address] Phone: [phone]   Fax:  [phone]  Name: Bianca Smith MRN: [id number] Date of Birth: 12/25/1980

## 2017-06-02 NOTE — Patient Instructions (Addendum)
HIP: Hamstrings - Supine  Place strap around foot. Raise leg up, keeping knee straight.  Bend opposite knee to protect back if indicated. Hold 30 seconds. 3 reps per set, 2-3 sets per day  Outer Hip Stretch: Reclined IT Band Stretch (Strap)   Strap around one foot, pull leg across body until you feel a pull or stretch in the outside of your hip, with shoulders on mat. Hold for 30 seconds. Repeat 3 times each leg. 2-3 times/day.  Piriformis Stretch   Lying on back, pull right knee toward opposite shoulder. Hold 30 seconds. Repeat 3 times. Do 2-3 sessions per day.   Quads / HF, Supine   Lie near edge of bed, pull both knees up toward chest. Hold one knee as you drop the other leg off the edge of the bed.  Relax hanging knee/can bend knee back if indicated. Hold 30 seconds. Repeat 3 times per session. Do 2-3 sessions per day.    Quads / HF, Prone KNEE: Quadriceps - Prone    Place strap around ankle. Bring ankle toward buttocks. Press hip into surface. Hold 30 seconds. Repeat 3 times per session. Do 2-3 sessions per day.   Ankle Pump    With left leg elevated, gently flex and extend ankle. Move through full range of motion. Avoid pain. Repeat __10__ times per set. Do _1-2___ sets per session. Do __2__ sessions per day.   ROM: Inversion / Eversion    With left leg relaxed, gently turn ankle and foot in and out. Move through full range of motion. Avoid pain. Repeat __10__ times per set. Do __1-2__ sets per session. Do __2__ sessions per day.    Ankle Alphabet    Using left ankle and foot only, trace the letters of the alphabet. Perform A to Z. Repeat __1__ times per set. Do _1___ sets per session. Do __2__ sessions per day.   IONTOPHORESIS PATIENT PRECAUTIONS & CONTRAINDICATIONS:  . Redness under one or both electrodes can occur.  This characterized by a uniform redness that usually disappears within 12 hours of treatment. . Small pinhead size blisters may  result in response to the drug.  Contact your physician if the problem persists more than 24 hours. . On rare occasions, iontophoresis therapy can result in temporary skin reactions such as rash, inflammation, irritation or burns.  The skin reactions may be the result of individual sensitivity to the ionic solution used, the condition of the skin at the start of treatment, reaction to the materials in the electrodes, allergies or sensitivity to dexamethasone, or a poor connection between the patch and your skin.  Discontinue using iontophoresis if you have any of these reactions and report to your therapist. . Remove the Patch or electrodes if you have any undue sensation of pain or burning during the treatment and report discomfort to your therapist. . Tell your Therapist if you have had known adverse reactions to the application of electrical current. . If using the Patch, the LED light will turn off when treatment is complete and the patch can be removed.  Approximate treatment time is 1-3 hours.  Remove the patch when light goes off or after 6 hours. . The Patch can be worn during normal activity, however excessive motion where the electrodes have been placed can cause poor contact between the skin and the electrode or uneven electrical current resulting in greater risk of skin irritation. Marland Kitchen Keep out of the reach of children.   . DO NOT use if  you have a cardiac pacemaker or any other electrically sensitive implanted device. . DO NOT use if you have a known sensitivity to dexamethasone. . DO NOT use during Magnetic Resonance Imaging (MRI). . DO NOT use over broken or compromised skin (e.g. sunburn, cuts, or acne) due to the increased risk of skin reaction. . DO NOT SHAVE over the area to be treated:  To establish good contact between the Patch and the skin, excessive hair may be clipped. . DO NOT place the Patch or electrodes on or over your eyes, directly over your heart, or brain. . DO NOT  reuse the Patch or electrodes as this may cause burns to occur.

## 2017-06-09 ENCOUNTER — Encounter: Payer: Federal, State, Local not specified - PPO | Admitting: Rehabilitative and Restorative Service Providers"

## 2017-06-12 ENCOUNTER — Encounter: Payer: Federal, State, Local not specified - PPO | Admitting: Rehabilitative and Restorative Service Providers"

## 2017-06-16 ENCOUNTER — Encounter: Payer: Federal, State, Local not specified - PPO | Admitting: Physical Therapy

## 2017-06-19 ENCOUNTER — Encounter: Payer: Self-pay | Admitting: Rehabilitative and Restorative Service Providers"

## 2017-06-19 ENCOUNTER — Ambulatory Visit (INDEPENDENT_AMBULATORY_CARE_PROVIDER_SITE_OTHER): Payer: Federal, State, Local not specified - PPO | Admitting: Rehabilitative and Restorative Service Providers"

## 2017-06-19 DIAGNOSIS — M25571 Pain in right ankle and joints of right foot: Secondary | ICD-10-CM | POA: Diagnosis not present

## 2017-06-19 DIAGNOSIS — R29898 Other symptoms and signs involving the musculoskeletal system: Secondary | ICD-10-CM

## 2017-06-19 NOTE — Therapy (Signed)
[place], [place], [address] Phone: [phone]   Fax:  [phone]  Physical Therapy Treatment  Patient Details  Name: Bianca Smith MRN: [id number] Date of Birth: 08/22/1980 Referring Provider: Mechele Claude PA-C   Encounter Date: 06/19/2017  PT End of Session - 06/19/17 1539    Visit Number  2    Number of Visits  12    Date for PT Re-Evaluation  07/14/17    PT Start Time  0086    PT Stop Time  1634    PT Time Calculation (min)  57 min    Activity Tolerance  Patient tolerated treatment well       Past Medical History:  Diagnosis Date  . Complication of anesthesia    crying  . Thrombocytosis ([place])     Past Surgical History:  Procedure Laterality Date  . BONE MARROW BIOPSY    . HYSTEROSCOPY    . IUD REMOVAL    . ROBOT ASSISTED MYOMECTOMY N/A 10/11/2015   Procedure: ROBOTIC ASSISTED MYOMECTOMY;  Surgeon: Delsa Bern, MD;  Location: [facility];  Service: Gynecology;  Laterality: N/A;  with power morcellation   . WISDOM TOOTH EXTRACTION      There were no vitals filed for this visit.  Subjective Assessment - 06/19/17 1543    Subjective  Good improvement with initial treatment. She had return of pain in the past 2-3 days but not as severe as it was. She has been doin g her exercises at home.     Currently in Pain?  Yes    Pain Score  4     Pain Location  Foot    Pain Orientation  Right    Pain Descriptors / Indicators  Aching;Nagging    Pain Type  Acute pain    Pain Onset  More than a month ago    Pain Frequency  Intermittent         [facility] PT Assessment - 06/19/17 0001      Assessment   Medical Diagnosis  Rt tibialis posterior tendinitis     Referring Provider  Mechele Claude PA-C    Onset Date/Surgical Date  05/06/16    Hand Dominance  Right    Next MD Visit  06/09/17    Prior Therapy  none       AROM   Right/Left Hip  -- tight end ranges Rt > Lt     Right/Left Ankle  -- no  pain with active movement Rt ankle     Right Ankle Dorsiflexion  12      Flexibility   Hamstrings  tigth Rt 80 deg; Lt 85 deg    Quadriceps  tight Rt     ITB  tight Rt    Piriformis  tight Rt       Palpation   Palpation comment  Rt hip to foot tight and tender to palpation; Rt leg tight; tender to palpation posterior medial malleolus at insertion of tibialis posterior and along path of posterior tib on Rt                   [facility] Adult PT Treatment/Exercise - 06/19/17 0001      Knee/Hip Exercises: Stretches   Passive Hamstring Stretch  Right;2 reps;30 seconds supine with strap     Quad Stretch  Right;2 reps;30 seconds prone with strap     Hip Flexor Stretch  Right;2 reps;30 seconds supine bilat knees to chest; Rt  LE off table     ITB Stretch  Right;1 rep;30 seconds supine with strap     Piriformis Stretch  Right;3 reps;30 seconds supine travell - varying angles       Iontophoresis   Type of Iontophoresis  Dexamethasone    Location  Rt poet tib at medial malleolus     Dose  120 mAmp    Time  10-12 hours       Manual Therapy   Manual therapy comments  pt supine hooklying LE supported on bolster     Joint Mobilization  mobs of calcaneous and talus/hind foot to mid foot     Soft tissue mobilization  posterior tib along leg to medial malleoli and navicular bone     Myofascial Release  Rt medial leg       Ankle Exercises: Stretches   Gastroc Stretch  3 reps;30 seconds sitting with strap              PT Education - 06/19/17 1629    Education provided  Yes  (Pended)     Education Details  HEP   (Pended)     Person(s) Educated  Patient  (Pended)     Methods  Explanation;Demonstration;Tactile cues;Verbal cues;Handout  (Pended)     Comprehension  Verbalized understanding;Returned demonstration;Verbal cues required;Tactile cues required  (Pended)           PT Long Term Goals - 06/19/17 1546      PT LONG TERM GOAL #1   Title  Increase Rt LE moblity and ROM to  equal or greater than Lt LE 07/14/17    Time  6    Period  Weeks    Status  On-going      PT LONG TERM GOAL #2   Title  Improve strength Rt LE to 4+/5 or greater Rt ankle 07/14/17    Time  6    Period  Weeks    Status  On-going      PT LONG TERM GOAL #3   Title  Decrease pain allowing patient to stand and walk for functional times of 10-15 min with minimal to no pain Rt LE 07/14/17    Time  6    Period  Weeks    Status  On-going      PT LONG TERM GOAL #4   Title  Independent in HEP 07/14/17    Time  6    Period  Weeks    Status  On-going      PT LONG TERM GOAL #5   Title  Improve FOTO to </= 36% limitation 07/14/17    Time  6    Period  Weeks    Status  On-going            Plan - 06/19/17 1655    Clinical Impression Statement  Excellent progress since initial evaluation. Patient reports pain free Rt ankle following treatment and ionto patch. She has had increase in pain in the past 2-3 days. Pt demonstrates good gains in ROM with decreased tenderness to palpatioin Rt ankle. She has decreased pain with movement and functional activities. She is progressing well toward goals of therapy. She needs to be more consistent with HEP.      Rehab Potential  Good    PT Frequency  2x / week    PT Duration  6 weeks    PT Treatment/Interventions  Patient/family education;ADLs/Self Care Home Management;Cryotherapy;Electrical Stimulation;Iontophoresis '4mg'$ /ml Dexamethasone;Moist Heat;Ultrasound;Dry needling;Manual techniques;Neuromuscular re-education;Gait training;Therapeutic activities;Therapeutic exercise;Taping  PT Next Visit Plan  review HEP; add manual work and modalities as indicated; assess response to exercise and gradually add exercise with focus on ROM and active movement as tendintis and LE inflammation subsides - then gradually add strengthening as indicated; continue ionto and continue with antiinflammatory measures including ionto; consider taping     Consulted and Agree with  Plan of Care  Patient       Patient will benefit from skilled therapeutic intervention in order to improve the following deficits and impairments:  Postural dysfunction, Improper body mechanics, Pain, Abnormal gait, Increased fascial restricitons, Increased muscle spasms, Decreased mobility, Decreased range of motion, Decreased strength, Decreased activity tolerance  Visit Diagnosis: Pain in right ankle and joints of right foot  Other symptoms and signs involving the musculoskeletal system     Problem List Patient Active Problem List   Diagnosis Date Noted  . Drug reaction 04/08/2017  . Migraine without aura and with status migrainosus, not intractable 07/08/2016  . Morbid obesity ([place]) 04/08/2016  . Sleep related headaches 04/08/2016  . Snoring 04/08/2016  . OSA (obstructive sleep apnea) 04/08/2016  . Intractable chronic migraine without aura and with status migrainosus 04/08/2016  . Persistent headaches 04/08/2016  . Fibroid, uterine 10/11/2015    Cletis Muma Nilda Simmer PT, MPH  06/19/2017, 4:59 PM  [facility] [place], [place], [address] Phone: [phone]   Fax:  [phone]  Name: Bianca Smith MRN: [id number] Date of Birth: 1980/10/20

## 2017-06-19 NOTE — Patient Instructions (Addendum)
Gastroc, Sitting (Passive)    Sit with strap or towel around ball of foot. Gently pull toward body. Hold _30__ seconds.  Repeat __3_ times per session. Do __2_ sessions per day.     All band exercises start with 10 reps for 2-3 sets  Start with slow reps the add fast reps as tolerated   Dorsiflexion: Resisted    Facing anchor, tubing around left foot, pull toward face.  Repeat ____ times per set. Do ____ sets per session. Do ____ sessions per day.   Plantar Flexion: Resisted    Anchor behind, tubing around left foot, press down. Repeat ____ times per set. Do ____ sets per session. Do ____ sessions per day.   Inversion: Resisted    Cross legs with right leg underneath, foot in tubing loop. Hold tubing around other foot to resist and turn foot in. Repeat ____ times per set. Do ____ sets per session. Do ____ sessions per day.    Eversion: Resisted    With right foot in tubing loop, hold tubing around other foot to resist and turn foot out. Repeat ____ times per set. Do ____ sets per session. Do ____ sessions per day.

## 2017-06-23 ENCOUNTER — Ambulatory Visit: Payer: Federal, State, Local not specified - PPO | Admitting: Physical Therapy

## 2017-06-23 ENCOUNTER — Encounter: Payer: Federal, State, Local not specified - PPO | Admitting: Rehabilitative and Restorative Service Providers"

## 2017-06-23 ENCOUNTER — Encounter: Payer: Self-pay | Admitting: Physical Therapy

## 2017-06-23 DIAGNOSIS — M25571 Pain in right ankle and joints of right foot: Secondary | ICD-10-CM | POA: Diagnosis not present

## 2017-06-23 DIAGNOSIS — R29898 Other symptoms and signs involving the musculoskeletal system: Secondary | ICD-10-CM | POA: Diagnosis not present

## 2017-06-23 NOTE — Therapy (Signed)
[facility] [place]  [place], [place], [address] Phone: [phone]   Fax:  [phone]  Physical Therapy Treatment  Patient Details  Name: Bianca Smith MRN: [id number] Date of Birth: [birth year] Referring Provider: Mechele Claude PA-C.    Encounter Date: 06/23/2017  PT End of Session - 06/23/17 0943    Visit Number  3    Number of Visits  12    Date for PT Re-Evaluation  07/14/17    PT Start Time  0935    PT Stop Time  1022    PT Time Calculation (min)  47 min       Past Medical History:  Diagnosis Date  . Complication of anesthesia    crying  . Thrombocytosis ([place])     Past Surgical History:  Procedure Laterality Date  . BONE MARROW BIOPSY    . HYSTEROSCOPY    . IUD REMOVAL    . ROBOT ASSISTED MYOMECTOMY N/A 10/11/2015   Procedure: ROBOTIC ASSISTED MYOMECTOMY;  Surgeon: Delsa Bern, MD;  Location: [facility];  Service: Gynecology;  Laterality: N/A;  with power morcellation   . WISDOM TOOTH EXTRACTION      There were no vitals filed for this visit.  Subjective Assessment - 06/23/17 0940    Subjective  Pt reports she did a lot of walking the other day, running errands so her foot is hurting more today.  She is stretching 1-2x/day. She had relief from last ionto patch.     Patient Stated Goals  get rid of the Rt foot and leg pain     Currently in Pain?  Yes    Pain Score  5     Pain Location  Foot    Pain Orientation  Right    Pain Descriptors / Indicators  Aching;Nagging;Burning    Aggravating Factors   weight bearing, prolonged standing     Pain Relieving Factors  voltaren gel; rest.          [facility] PT Assessment - 06/23/17 0001      Assessment   Medical Diagnosis  Rt tibialis posterior tendinitis     Referring Provider  Mechele Claude PA-C.     Onset Date/Surgical Date  05/06/16    Hand Dominance  Right    Next MD Visit  06/30/17      AROM   Right Ankle Dorsiflexion  -5    Right Ankle Plantar  Flexion  59    Right Ankle Inversion  28    Right Ankle Eversion  19       [facility] Adult PT Treatment/Exercise - 06/23/17 0001      Self-Care   Self-Care  Heat/Ice Application    Heat/Ice Application  educated pt on benefits of ice appplication to affected area, and parameters.       Knee/Hip Exercises: Stretches   Passive Hamstring Stretch  Right;Left;2 reps;30 seconds supine with strap     Piriformis Stretch  2 reps;Right;Left      Modalities   Modalities  Ultrasound      Ultrasound   Ultrasound Location  Rt medial ankle (ant to medial malleoli)     Ultrasound Parameters  50%, 3.3 mHz, 1.0 w/cm2, 8 min     Ultrasound Goals  Pain;Edema      Iontophoresis   Type of Iontophoresis  Dexamethasone    Location  Rt post tib at medial malleolus     Dose  1.0 cc    Time  120  mA 12 hr patch      Manual Therapy   Manual therapy comments  pt supine hooklying LE supported on bolster     Joint Mobilization  talocrural mobs grade I and II.     Soft tissue mobilization  STM to Rt post tib, TPR to Rt ant tib       Ankle Exercises: Stretches   Soleus Stretch  2 reps;30 seconds RLE    Gastroc Stretch  2 reps;30 seconds RLE      Ankle Exercises: Aerobic   Stationary Bike  L2: 6.5 min  PTA present to discuss progress                  PT Long Term Goals - 06/19/17 1546      PT LONG TERM GOAL #1   Title  Increase Rt LE moblity and ROM to equal or greater than Lt LE 07/14/17    Time  6    Period  Weeks    Status  On-going      PT LONG TERM GOAL #2   Title  Improve strength Rt LE to 4+/5 or greater Rt ankle 07/14/17    Time  6    Period  Weeks    Status  On-going      PT LONG TERM GOAL #3   Title  Decrease pain allowing patient to stand and walk for functional times of 10-15 min with minimal to no pain Rt LE 07/14/17    Time  6    Period  Weeks    Status  On-going      PT LONG TERM GOAL #4   Title  Independent in HEP 07/14/17    Time  6    Period  Weeks    Status   On-going      PT LONG TERM GOAL #5   Title  Improve FOTO to </= 36% limitation 07/14/17    Time  6    Period  Weeks    Status  On-going            Plan - 06/23/17 1039    Clinical Impression Statement  Pt demonstrated decreased Rt ankle DF since last visit.  Flare up with prolonged walking this week.  She had visible swelling and point tenderness over ant portion of medial malleoli (tibialus ant and extensor hallicus longus tendons).   Pt had set back since last visit; no new goals met this visit.    Rehab Potential  Good    PT Frequency  2x / week    PT Duration  6 weeks    PT Treatment/Interventions  Patient/family education;ADLs/Self Care Home Management;Cryotherapy;Electrical Stimulation;Iontophoresis 58m/ml Dexamethasone;Moist Heat;Ultrasound;Dry needling;Manual techniques;Neuromuscular re-education;Gait training;Therapeutic activities;Therapeutic exercise;Taping    PT Next Visit Plan  review HEP; add manual work and modalities as indicated; assess response to exercise and gradually add exercise with focus on ROM and active movement as tendintis and LE inflammation subsides - then gradually add strengthening as indicated; continue ionto and continue with antiinflammatory measures including ionto; consider taping     Consulted and Agree with Plan of Care  Patient       Patient will benefit from skilled therapeutic intervention in order to improve the following deficits and impairments:  Postural dysfunction, Improper body mechanics, Pain, Abnormal gait, Increased fascial restricitons, Increased muscle spasms, Decreased mobility, Decreased range of motion, Decreased strength, Decreased activity tolerance  Visit Diagnosis: Pain in right ankle and joints of right foot  Other symptoms  and signs involving the musculoskeletal system     Problem List Patient Active Problem List   Diagnosis Date Noted  . Drug reaction 04/08/2017  . Migraine without aura and with status migrainosus,  not intractable 07/08/2016  . Morbid obesity ([place]) 04/08/2016  . Sleep related headaches 04/08/2016  . Snoring 04/08/2016  . OSA (obstructive sleep apnea) 04/08/2016  . Intractable chronic migraine without aura and with status migrainosus 04/08/2016  . Persistent headaches 04/08/2016  . Fibroid, uterine 10/11/2015   Kerin Perna, PTA 06/23/17 1:38 PM  [facility] [place], [place], [address] Phone: [phone]   Fax:  [phone]  Name: TIFFIANY BEADLES MRN: [id number] Date of Birth: 28-Jun-1980

## 2017-06-26 ENCOUNTER — Encounter: Payer: Federal, State, Local not specified - PPO | Admitting: Physical Therapy

## 2017-07-04 ENCOUNTER — Encounter: Payer: Federal, State, Local not specified - PPO | Admitting: Physical Therapy

## 2017-07-07 ENCOUNTER — Ambulatory Visit: Payer: Federal, State, Local not specified - PPO | Admitting: Physical Therapy

## 2017-07-07 DIAGNOSIS — M25571 Pain in right ankle and joints of right foot: Secondary | ICD-10-CM | POA: Diagnosis not present

## 2017-07-07 DIAGNOSIS — R29898 Other symptoms and signs involving the musculoskeletal system: Secondary | ICD-10-CM | POA: Diagnosis not present

## 2017-07-07 NOTE — Patient Instructions (Addendum)
Foot: Heel Raises    Sitting, place right foot flat on floor, knee pointed forward. Lift heel off floor. Do not allow knee to tilt. Keep toes on floor. Hold __2__ seconds. Repeat ___20_ times. Do __2__ sessions per day. CAUTION: Repetitions should be slow and controlled. * TOE AND HEEL Raises *  ANKLE: Eversion, Unilateral (Band)    Place band around left foot. Keeping heel in place, raise toes of banded foot up and away from body. Do not move hip. Yellow or Red band. _10__ reps per set, _2-3__ sets per day  Knee Extension: Resisted (Sitting)    With band looped around right ankle and under other foot, straighten leg with ankle loop. Keep other leg bent to increase resistance. Repeat __10__ times per set, 5-10 sec hold. Do __2__ sets per session. Do __1__ sessions per day.  Hamstring Curl: Resisted (Sitting)    Facing anchor with tubing on right ankle, leg straight out, bend knee. Repeat __10__ times per set. Do _2___ sets per session. Do __1__ sessions per day.   Hamstring Step 1    Straighten Right knee. Lift leg until a stretch on back of leg. Hold _30__ seconds. Relax knee by returning foot to start. Repeat __2_ times.   [facility] [place], [place] [address]  [phone] (office) [phone] (fax)

## 2017-07-07 NOTE — Therapy (Signed)
[place], [place], [address] Phone: [phone]   Fax:  [phone]  Physical Therapy Treatment  Patient Details  Name: Bianca Smith MRN: [id number] Date of Birth: 02-01-1981 Referring Provider: Mechele Claude PA-C    Encounter Date: 07/07/2017  PT End of Session - 07/07/17 1139    Visit Number  4    Number of Visits  12    Date for PT Re-Evaluation  07/14/17    PT Start Time  1138    PT Stop Time  1226    PT Time Calculation (min)  48 min    Activity Tolerance  Patient tolerated treatment well    Behavior During Therapy  [facility] for tasks assessed/performed       Past Medical History:  Diagnosis Date  . Complication of anesthesia    crying  . Thrombocytosis ([place])     Past Surgical History:  Procedure Laterality Date  . BONE MARROW BIOPSY    . HYSTEROSCOPY    . IUD REMOVAL    . ROBOT ASSISTED MYOMECTOMY N/A 10/11/2015   Procedure: ROBOTIC ASSISTED MYOMECTOMY;  Surgeon: Delsa Bern, MD;  Location: [facility];  Service: Gynecology;  Laterality: N/A;  with power morcellation   . WISDOM TOOTH EXTRACTION      There were no vitals filed for this visit.  Subjective Assessment - 07/07/17 1139    Subjective  Pt reports she has been trying to stay off feet and rest her Rt ankle more.  Her work schedule has continued to be busy with travel every day.   She feels she can stretch her calf a little easier.  The last ionto patch gave her a couple days of relief.  She reports 30% improvement in condition.      Patient Stated Goals  get rid of the Rt foot and leg pain     Currently in Pain?  Yes    Pain Score  3     Pain Location  Ankle    Pain Orientation  Right    Pain Descriptors / Indicators  Aching    Aggravating Factors   prolonged standing and walking     Pain Relieving Factors  voltaren gel; rest.          [facility] PT Assessment - 07/07/17 0001      Assessment   Medical Diagnosis  Rt tibialis  posterior tendinitis     Referring Provider  Mechele Claude PA-C     Onset Date/Surgical Date  05/06/16    Hand Dominance  Right    Next MD Visit  07/21/17      AROM   Right Ankle Dorsiflexion  6    Right Ankle Plantar Flexion  59    Right Ankle Inversion  32    Right Ankle Eversion  24      Strength   Right/Left Hip  Right    Right Hip Extension  4/5    Right Hip ABduction  -- 5-/5    Right Knee Flexion  4+/5    Right Knee Extension  4+/5        [facility] Adult PT Treatment/Exercise - 07/07/17 0001      Knee/Hip Exercises: Stretches   Passive Hamstring Stretch  Right;Left;2 reps;30 seconds supine with strap       Knee/Hip Exercises: Prone   Hamstring Curl  1 set;10 reps 5# on Rt ankle    Straight Leg Raises  Strengthening;Right;Left;1 set;10 reps  Iontophoresis   Type of Iontophoresis  Dexamethasone    Location  Rt medial ant ankle     Dose  1.0 cc    Time  120 mA 12 hr patch      Ankle Exercises: Stretches   Plantar Fascia Stretch  2 reps;30 seconds RLE prostretch    Soleus Stretch  2 reps;30 seconds bilat    Gastroc Stretch  2 reps;30 seconds bilat       Ankle Exercises: Aerobic   Stationary Bike  NuStep L5: 7 min  PTA present to discuss progress      Ankle Exercises: Seated   Heel Raises  20 reps    Toe Raise  20 reps    Other Seated Ankle Exercises  Rt ankle eversion with yllow band x 20, red band x 8 reps;  LAQ with RLE with Red band x 15; RLE seated hamstring curl with red band x 10, green band x 10.                PT Education - 07/07/17 1236    Education provided  Yes    Education Details  HEP - issued yellow, red, green band.     Person(s) Educated  Patient    Methods  Explanation;Demonstration;Tactile cues;Verbal cues;Handout    Comprehension  Returned demonstration;Verbalized understanding          PT Long Term Goals - 06/19/17 1546      PT LONG TERM GOAL #1   Title  Increase Rt LE moblity and ROM to equal or greater than Lt LE 07/14/17     Time  6    Period  Weeks    Status  On-going      PT LONG TERM GOAL #2   Title  Improve strength Rt LE to 4+/5 or greater Rt ankle 07/14/17    Time  6    Period  Weeks    Status  On-going      PT LONG TERM GOAL #3   Title  Decrease pain allowing patient to stand and walk for functional times of 10-15 min with minimal to no pain Rt LE 07/14/17    Time  6    Period  Weeks    Status  On-going      PT LONG TERM GOAL #4   Title  Independent in HEP 07/14/17    Time  6    Period  Weeks    Status  On-going      PT LONG TERM GOAL #5   Title  Improve FOTO to </= 36% limitation 07/14/17    Time  6    Period  Weeks    Status  On-going            Plan - 07/07/17 1233    Clinical Impression Statement  Pt demonstrated improved Rt ankle ROM.  Persistent weakness in RLE - hamstring and quad with MMT; assigned exercises in HEP to address this.  Pt continues to have visible swelling and point tenderness over extensor hallicus longus tendon as it crosses over ant ankle; ionto patch applied here. Pt progressing gradually towards goals; has partially met LTG#1.  Her work schedule has been a barrier to more regular therapy appts.     Rehab Potential  Good    PT Frequency  2x / week    PT Duration  6 weeks    PT Treatment/Interventions  Patient/family education;ADLs/Self Care Home Management;Cryotherapy;Electrical Stimulation;Iontophoresis '4mg'$ /ml Dexamethasone;Moist Heat;Ultrasound;Dry needling;Manual techniques;Neuromuscular re-education;Gait training;Therapeutic  activities;Therapeutic exercise;Taping    PT Next Visit Plan  assess response to HEP.  Continue ionto; trial pulsed [place] to same area. progress LE strengthening as tolerated.     Consulted and Agree with Plan of Care  Patient       Patient will benefit from skilled therapeutic intervention in order to improve the following deficits and impairments:  Postural dysfunction, Improper body mechanics, Pain, Abnormal gait, Increased fascial  restricitons, Increased muscle spasms, Decreased mobility, Decreased range of motion, Decreased strength, Decreased activity tolerance  Visit Diagnosis: Pain in right ankle and joints of right foot  Other symptoms and signs involving the musculoskeletal system     Problem List Patient Active Problem List   Diagnosis Date Noted  . Drug reaction 04/08/2017  . Migraine without aura and with status migrainosus, not intractable 07/08/2016  . Morbid obesity ([place]) 04/08/2016  . Sleep related headaches 04/08/2016  . Snoring 04/08/2016  . OSA (obstructive sleep apnea) 04/08/2016  . Intractable chronic migraine without aura and with status migrainosus 04/08/2016  . Persistent headaches 04/08/2016  . Fibroid, uterine 10/11/2015   Kerin Perna, PTA 07/07/17 12:52 PM  [place], [place], [address] Phone: [phone]   Fax:  [phone]  Name: Bianca Smith MRN: [id number] Date of Birth: Oct 29, 1980

## 2017-07-10 ENCOUNTER — Ambulatory Visit: Payer: Federal, State, Local not specified - PPO | Admitting: Physical Therapy

## 2017-07-10 DIAGNOSIS — R29898 Other symptoms and signs involving the musculoskeletal system: Secondary | ICD-10-CM

## 2017-07-10 DIAGNOSIS — M25571 Pain in right ankle and joints of right foot: Secondary | ICD-10-CM | POA: Diagnosis not present

## 2017-07-10 NOTE — Therapy (Signed)
[place], [place], [address] Phone: [phone]   Fax:  [phone]  Physical Therapy Treatment  Patient Details  Name: Bianca Smith MRN: [id number] Date of Birth: 04-30-1981 Referring Provider: Mechele Claude PA-C   Encounter Date: 07/10/2017  PT End of Session - 07/10/17 1404    Visit Number  5    Number of Visits  12    Date for PT Re-Evaluation  07/14/17    PT Start Time  0355    PT Stop Time  1456    PT Time Calculation (min)  53 min    Activity Tolerance  Patient tolerated treatment well    Behavior During Therapy  [facility] for tasks assessed/performed       Past Medical History:  Diagnosis Date  . Complication of anesthesia    crying  . Thrombocytosis ([place])     Past Surgical History:  Procedure Laterality Date  . BONE MARROW BIOPSY    . HYSTEROSCOPY    . IUD REMOVAL    . ROBOT ASSISTED MYOMECTOMY N/A 10/11/2015   Procedure: ROBOTIC ASSISTED MYOMECTOMY;  Surgeon: Delsa Bern, MD;  Location: [facility];  Service: Gynecology;  Laterality: N/A;  with power morcellation   . WISDOM TOOTH EXTRACTION      There were no vitals filed for this visit.  Subjective Assessment - 07/10/17 1404    Subjective  Pt reports she has been up on her feet more, busy with work.  She has been doing her HEP daily.  She reports the ABC's and other stretches help make her ankle not as sore when she is sitting. She's considering an injection upon return to MD.    Currently in Pain?  Yes    Pain Score  2     Pain Location  Ankle    Pain Orientation  Right    Pain Descriptors / Indicators  Aching;Dull    Aggravating Factors   prolonged standing and walking    Pain Relieving Factors  voltaren gel; rest.          [facility] PT Assessment - 07/10/17 0001      Assessment   Medical Diagnosis  Rt tibialis posterior tendinitis     Referring Provider  Mechele Claude PA-C    Onset Date/Surgical Date  05/06/16    Hand  Dominance  Right    Next MD Visit  07/21/17      AROM   Right Ankle Dorsiflexion  10      Strength   Right/Left Ankle  Right    Right Ankle Dorsiflexion  4+/5 with pain    Right Ankle Inversion  4+/5    Right Ankle Eversion  5/5      Flexibility   Hamstrings  RLE 65-70 deg        [facility] Adult PT Treatment/Exercise - 07/10/17 0001      Knee/Hip Exercises: Stretches   Passive Hamstring Stretch  Right;Left;2 reps;30 seconds supine with strap       Knee/Hip Exercises: Prone   Straight Leg Raises  Strengthening;Right;Left;1 set;10 reps      Modalities   Modalities  Iontophoresis;Vasopneumatic;Ultrasound      Ultrasound   Ultrasound Location  Rt ant/medial ankle    Ultrasound Parameters  50%, 3.3 mHz, 1.3 w/cm2 x 8 min     Ultrasound Goals  Edema;Pain      Iontophoresis   Type of Iontophoresis  Dexamethasone    Location  Rt ant ankle  Dose  1.0 cc    Time  120 mA 12 hr patch      Vasopneumatic   Number Minutes Vasopneumatic   15 minutes    Vasopnuematic Location   Ankle Rt    Vasopneumatic Pressure  Medium    Vasopneumatic Temperature   34 deg      Ankle Exercises: Stretches   Plantar Fascia Stretch  2 reps;30 seconds RLE prostretch    Soleus Stretch  2 reps;30 seconds bilat    Gastroc Stretch  2 reps;30 seconds bilat       Ankle Exercises: Aerobic   Stationary Bike  NuStep L5: 6 min  PTA present to discuss progress      Ankle Exercises: Seated   ABC's  1 rep                  PT Long Term Goals - 06/19/17 1546      PT LONG TERM GOAL #1   Title  Increase Rt LE moblity and ROM to equal or greater than Lt LE 07/14/17    Time  6    Period  Weeks    Status  On-going      PT LONG TERM GOAL #2   Title  Improve strength Rt LE to 4+/5 or greater Rt ankle 07/14/17    Time  6    Period  Weeks    Status  On-going      PT LONG TERM GOAL #3   Title  Decrease pain allowing patient to stand and walk for functional times of 10-15 min with minimal to no pain  Rt LE 07/14/17    Time  6    Period  Weeks    Status  On-going      PT LONG TERM GOAL #4   Title  Independent in HEP 07/14/17    Time  6    Period  Weeks    Status  On-going      PT LONG TERM GOAL #5   Title  Improve FOTO to </= 36% limitation 07/14/17    Time  6    Period  Weeks    Status  On-going            Plan - 07/10/17 1447    Clinical Impression Statement  Pt's Rt ankle DF has improved since last assessment.  Her Rt hamstring is tighter than last assessment; encouraged continued stretching to LE.  Focus on reduction in pain and localized swelling.  Pt making gradual progress towards goals.     Rehab Potential  Good    PT Frequency  2x / week    PT Duration  6 weeks    PT Treatment/Interventions  Patient/family education;ADLs/Self Care Home Management;Cryotherapy;Electrical Stimulation;Iontophoresis 16m/ml Dexamethasone;Moist Heat;Ultrasound;Dry needling;Manual techniques;Neuromuscular re-education;Gait training;Therapeutic activities;Therapeutic exercise;Taping    PT Next Visit Plan  Continue ionto; pulsed [place] same area. progress LE strengthening as tolerated.     Consulted and Agree with Plan of Care  Patient       Patient will benefit from skilled therapeutic intervention in order to improve the following deficits and impairments:  Postural dysfunction, Improper body mechanics, Pain, Abnormal gait, Increased fascial restricitons, Increased muscle spasms, Decreased mobility, Decreased range of motion, Decreased strength, Decreased activity tolerance  Visit Diagnosis: Pain in right ankle and joints of right foot  Other symptoms and signs involving the musculoskeletal system     Problem List Patient Active Problem List   Diagnosis Date Noted  .  Drug reaction 04/08/2017  . Migraine without aura and with status migrainosus, not intractable 07/08/2016  . Morbid obesity ([place]) 04/08/2016  . Sleep related headaches 04/08/2016  . Snoring 04/08/2016  . OSA  (obstructive sleep apnea) 04/08/2016  . Intractable chronic migraine without aura and with status migrainosus 04/08/2016  . Persistent headaches 04/08/2016  . Fibroid, uterine 10/11/2015   Kerin Perna, PTA 07/10/17 5:13 PM  [place], [place], [address] Phone: [phone]   Fax:  [phone]  Name: Bianca Smith MRN: [id number] Date of Birth: 19-Jul-1980

## 2017-07-14 ENCOUNTER — Ambulatory Visit: Payer: Federal, State, Local not specified - PPO | Admitting: Physical Therapy

## 2017-07-14 DIAGNOSIS — R29898 Other symptoms and signs involving the musculoskeletal system: Secondary | ICD-10-CM

## 2017-07-14 DIAGNOSIS — M25571 Pain in right ankle and joints of right foot: Secondary | ICD-10-CM

## 2017-07-14 NOTE — Therapy (Addendum)
[place], [place], [address] Phone: [phone]   Fax:  [phone]  Physical Therapy Treatment  Patient Details  Name: EARNESTEEN BIRNIE MRN: [id number] Date of Birth: December 18, 1980 Referring Provider: Mechele Claude PA-C   Encounter Date: 07/14/2017  PT End of Session - 07/14/17 1443    Visit Number  6    Number of Visits  12    Date for PT Re-Evaluation  07/14/17    PT Start Time  1436 pt arrived late    PT Stop Time  1530    PT Time Calculation (min)  54 min    Activity Tolerance  Patient tolerated treatment well    Behavior During Therapy  [facility] for tasks assessed/performed       Past Medical History:  Diagnosis Date  . Complication of anesthesia    crying  . Thrombocytosis ([place])     Past Surgical History:  Procedure Laterality Date  . BONE MARROW BIOPSY    . HYSTEROSCOPY    . IUD REMOVAL    . ROBOT ASSISTED MYOMECTOMY N/A 10/11/2015   Procedure: ROBOTIC ASSISTED MYOMECTOMY;  Surgeon: Delsa Bern, MD;  Location: [facility];  Service: Gynecology;  Laterality: N/A;  with power morcellation   . WISDOM TOOTH EXTRACTION      There were no vitals filed for this visit.  Subjective Assessment - 07/14/17 1720    Subjective  Pt reports she still has pain when she is up on her feet.  She feels doing the circles and ABCs with Rt foot throughout day helps.  She has not done other exercises for RLE due to time constraints.  She returns to MD in 1 wk.     Patient Stated Goals  get rid of the Rt foot and leg pain     Currently in Pain?  Yes    Pain Score  2     Pain Location  Ankle    Pain Orientation  Right    Pain Descriptors / Indicators  Aching    Aggravating Factors   walking    Pain Relieving Factors  voltaren gel; NWB         [facility] PT Assessment - 07/14/17 0001      Assessment   Medical Diagnosis  Rt tibialis posterior tendinitis     Referring Provider  Mechele Claude PA-C    Onset  Date/Surgical Date  05/06/16    Hand Dominance  Right    Next MD Visit  07/21/17    Prior Therapy  none       Observation/Other Assessments   Focus on Therapeutic Outcomes (FOTO)   45% limited; goal of 36% limited.       AROM   Right Ankle Dorsiflexion  10    Right Ankle Plantar Flexion  59    Right Ankle Inversion  42    Right Ankle Eversion  32      Strength   Right/Left Hip  Right    Right Hip Extension  4+/5    Right Knee Flexion  -- 5-/5    Right Knee Extension  4+/5    Right/Left Ankle  Right;Left    Right Ankle Dorsiflexion  -- 5-/5    Right Ankle Plantar Flexion  -- single heel raise, 1.75" heel off floor, painful    Right Ankle Inversion  -- 5-/5    Right Ankle Eversion  4+/5    Left Ankle Plantar Flexion  -- 2.5" heel  lift off of floor                  [facility] Adult PT Treatment/Exercise - 07/14/17 0001      Knee/Hip Exercises: Prone   Straight Leg Raises  Strengthening;Right;Left;1 set;10 reps 3 sec hold      Ultrasound   Ultrasound Location  Rt ant/medial ankle    Ultrasound Parameters  50%, 3.3 mHz, 1.2 w/cm 2 x 8 min     Ultrasound Goals  Edema;Pain      Iontophoresis   Type of Iontophoresis  Dexamethasone    Location  Rt ant ankle     Dose  1.0 cc    Time  80 mA; 6 hr patch      Vasopneumatic   Number Minutes Vasopneumatic   15 minutes    Vasopnuematic Location   Ankle Rt    Vasopneumatic Pressure  Medium    Vasopneumatic Temperature   34 deg      Ankle Exercises: Stretches   Soleus Stretch  2 reps;30 seconds bilat    Gastroc Stretch  2 reps bilat, 45 sec on Prostretch    Other Stretch  Rt/Lt quad stretch x 3 reps of 30 sec;  Rt hamstring stretch 30 sec x 2 reps       Ankle Exercises: Aerobic   Stationary Bike  NuStep L5: 5 min  PTA present to discuss progress      Ankle Exercises: Seated   ABC's  1 rep    Other Seated Ankle Exercises  Rt ankle eversion, inversion, and DF with yellow band x 15 reps each.       Ankle Exercises:  Standing   Heel Raises  -- 1 rep each foot; unable to tolerate RLE heel raise                  PT Long Term Goals - 07/14/17 1450      PT LONG TERM GOAL #1   Title  Increase Rt LE moblity and ROM to equal or greater than Lt LE 07/14/17    Time  6    Period  Weeks    Status  On-going      PT LONG TERM GOAL #2   Title  Improve strength Rt LE to 4+/5 or greater Rt ankle 07/14/17    Time  6    Period  Weeks    Status  Partially Met      PT LONG TERM GOAL #3   Title  Decrease pain allowing patient to stand and walk for functional times of 10-15 min with minimal to no pain Rt LE 07/14/17    Time  6    Period  Weeks    Status  On-going continues to be painful      PT LONG TERM GOAL #4   Title  Independent in HEP 07/14/17    Time  6    Period  Weeks    Status  On-going      PT LONG TERM GOAL #5   Title  Improve FOTO to </= 36% limitation 07/14/17    Time  6    Period  Weeks    Status  On-going            Plan - 07/14/17 1459    Clinical Impression Statement  Pt continues to have weakness in RLE (hip ext, knee ext, ankle eversion). Her Rt ankle ROM has improved significantly since eval.  She has much less palpable  swelling in the ant Rt ankle than in past visits.  Her FOTO score has improved.  She tolerated all NWB exercises well, but has more pain and difficulty once in WB position.  She has partially met her goals and requests to hold therapy until she sees MD next wk.     Rehab Potential  Good    PT Frequency  2x / week    PT Duration  6 weeks    PT Treatment/Interventions  Patient/family education;ADLs/Self Care Home Management;Cryotherapy;Electrical Stimulation;Iontophoresis '4mg'$ /ml Dexamethasone;Moist Heat;Ultrasound;Dry needling;Manual techniques;Neuromuscular re-education;Gait training;Therapeutic activities;Therapeutic exercise;Taping    PT Next Visit Plan  hold therapy until after MD appt on 3/18.  await further advisement from MD. If pt doesn't return by 3/26;  will d/c to HEP.     Consulted and Agree with Plan of Care  Patient       Patient will benefit from skilled therapeutic intervention in order to improve the following deficits and impairments:  Postural dysfunction, Improper body mechanics, Pain, Abnormal gait, Increased fascial restricitons, Increased muscle spasms, Decreased mobility, Decreased range of motion, Decreased strength, Decreased activity tolerance  Visit Diagnosis: Pain in right ankle and joints of right foot  Other symptoms and signs involving the musculoskeletal system     Problem List Patient Active Problem List   Diagnosis Date Noted  . Drug reaction 04/08/2017  . Migraine without aura and with status migrainosus, not intractable 07/08/2016  . Morbid obesity ([place]) 04/08/2016  . Sleep related headaches 04/08/2016  . Snoring 04/08/2016  . OSA (obstructive sleep apnea) 04/08/2016  . Intractable chronic migraine without aura and with status migrainosus 04/08/2016  . Persistent headaches 04/08/2016  . Fibroid, uterine 10/11/2015  Kerin Perna, PTA 07/14/17 5:22 PM  [facility] [place], [place], [address] Phone: [phone]   Fax:  [phone]  Name: REKIA KUJALA MRN: [id number] Date of Birth: 07/05/1980   PHYSICAL THERAPY DISCHARGE SUMMARY  Visits from Start of Care: 6  Current functional level related to goals / functional outcomes: Unknown current function   Remaining deficits: unknown   Education / Equipment: HEP Plan: Patient agrees to discharge.  Patient goals were partially met. Patient is being discharged due to                                                    Patient was going to see her doctor the week after this appointment. If he wanted her to continue with therapy she was going to call and schedule.  No call has been received so we will discharge her.  ?????     Jeral Pinch, PT 08/06/17 8:49 AM

## 2017-10-07 ENCOUNTER — Ambulatory Visit: Payer: Federal, State, Local not specified - PPO | Admitting: Neurology

## 2019-06-07 ENCOUNTER — Encounter (HOSPITAL_COMMUNITY): Payer: Self-pay | Admitting: Obstetrics & Gynecology

## 2019-06-07 ENCOUNTER — Inpatient Hospital Stay (HOSPITAL_COMMUNITY)
Admission: AD | Admit: 2019-06-07 | Discharge: 2019-06-13 | DRG: 787 | Disposition: A | Discharge status: Home / Self Care | Payer: No Typology Code available for payment source | Attending: Obstetrics and Gynecology | Admitting: Obstetrics and Gynecology

## 2019-06-07 ENCOUNTER — Other Ambulatory Visit: Payer: Self-pay

## 2019-06-07 DIAGNOSIS — O99824 Streptococcus B carrier state complicating childbirth: Secondary | ICD-10-CM | POA: Diagnosis not present

## 2019-06-07 DIAGNOSIS — O09513 Supervision of elderly primigravida, third trimester: Secondary | ICD-10-CM | POA: Diagnosis not present

## 2019-06-07 DIAGNOSIS — G43001 Migraine without aura, not intractable, with status migrainosus: Secondary | ICD-10-CM

## 2019-06-07 DIAGNOSIS — L910 Hypertrophic scar: Secondary | ICD-10-CM | POA: Diagnosis not present

## 2019-06-07 DIAGNOSIS — O9972 Diseases of the skin and subcutaneous tissue complicating childbirth: Secondary | ICD-10-CM | POA: Diagnosis present

## 2019-06-07 DIAGNOSIS — D259 Leiomyoma of uterus, unspecified: Secondary | ICD-10-CM | POA: Diagnosis not present

## 2019-06-07 DIAGNOSIS — O4103X0 Oligohydramnios, third trimester, not applicable or unspecified: Secondary | ICD-10-CM | POA: Diagnosis not present

## 2019-06-07 DIAGNOSIS — O139 Gestational [pregnancy-induced] hypertension without significant proteinuria, unspecified trimester: Secondary | ICD-10-CM

## 2019-06-07 DIAGNOSIS — O99214 Obesity complicating childbirth: Secondary | ICD-10-CM | POA: Diagnosis not present

## 2019-06-07 DIAGNOSIS — D75839 Thrombocytosis, unspecified: Secondary | ICD-10-CM | POA: Diagnosis present

## 2019-06-07 DIAGNOSIS — Z20822 Contact with and (suspected) exposure to covid-19: Secondary | ICD-10-CM | POA: Diagnosis not present

## 2019-06-07 DIAGNOSIS — O134 Gestational [pregnancy-induced] hypertension without significant proteinuria, complicating childbirth: Principal | ICD-10-CM | POA: Diagnosis present

## 2019-06-07 DIAGNOSIS — D473 Essential (hemorrhagic) thrombocythemia: Secondary | ICD-10-CM

## 2019-06-07 DIAGNOSIS — G43711 Chronic migraine without aura, intractable, with status migrainosus: Secondary | ICD-10-CM

## 2019-06-07 DIAGNOSIS — O4100X0 Oligohydramnios, unspecified trimester, not applicable or unspecified: Secondary | ICD-10-CM | POA: Diagnosis not present

## 2019-06-07 DIAGNOSIS — G4733 Obstructive sleep apnea (adult) (pediatric): Secondary | ICD-10-CM

## 2019-06-07 DIAGNOSIS — Z3490 Encounter for supervision of normal pregnancy, unspecified, unspecified trimester: Secondary | ICD-10-CM

## 2019-06-07 DIAGNOSIS — R519 Headache, unspecified: Secondary | ICD-10-CM

## 2019-06-07 DIAGNOSIS — O9962 Diseases of the digestive system complicating childbirth: Secondary | ICD-10-CM | POA: Diagnosis not present

## 2019-06-07 DIAGNOSIS — O133 Gestational [pregnancy-induced] hypertension without significant proteinuria, third trimester: Secondary | ICD-10-CM | POA: Diagnosis not present

## 2019-06-07 DIAGNOSIS — D563 Thalassemia minor: Secondary | ICD-10-CM | POA: Diagnosis present

## 2019-06-07 DIAGNOSIS — Z3A34 34 weeks gestation of pregnancy: Secondary | ICD-10-CM | POA: Diagnosis not present

## 2019-06-07 DIAGNOSIS — Z9889 Other specified postprocedural states: Secondary | ICD-10-CM

## 2019-06-07 DIAGNOSIS — E559 Vitamin D deficiency, unspecified: Secondary | ICD-10-CM

## 2019-06-07 DIAGNOSIS — K589 Irritable bowel syndrome without diarrhea: Secondary | ICD-10-CM | POA: Diagnosis not present

## 2019-06-07 DIAGNOSIS — O3413 Maternal care for benign tumor of corpus uteri, third trimester: Secondary | ICD-10-CM | POA: Diagnosis present

## 2019-06-07 DIAGNOSIS — B009 Herpesviral infection, unspecified: Secondary | ICD-10-CM

## 2019-06-07 DIAGNOSIS — R0683 Snoring: Secondary | ICD-10-CM

## 2019-06-07 DIAGNOSIS — N921 Excessive and frequent menstruation with irregular cycle: Secondary | ICD-10-CM

## 2019-06-07 DIAGNOSIS — Z3A35 35 weeks gestation of pregnancy: Secondary | ICD-10-CM | POA: Diagnosis not present

## 2019-06-07 LAB — COMPREHENSIVE METABOLIC PANEL
ALT: 14 U/L (ref 0–44)
AST: 19 U/L (ref 15–41)
Albumin: 2.6 g/dL — ABNORMAL LOW (ref 3.5–5.0)
Alkaline Phosphatase: 100 U/L (ref 38–126)
Anion gap: 10 (ref 5–15)
BUN: 6 mg/dL (ref 6–20)
CO2: 20 mmol/L — ABNORMAL LOW (ref 22–32)
Calcium: 9.7 mg/dL (ref 8.9–10.3)
Chloride: 107 mmol/L (ref 98–111)
Creatinine, Ser: 1.11 mg/dL — ABNORMAL HIGH (ref 0.44–1.00)
GFR calc Af Amer: >60 mL/min (ref >60)
GFR calc non Af Amer: >60 mL/min (ref >60)
Glucose, Bld: 76 mg/dL (ref 70–99)
Potassium: 4.2 mmol/L (ref 3.5–5.1)
Sodium: 137 mmol/L (ref 135–145)
Total Bilirubin: 0.4 mg/dL (ref 0.3–1.2)
Total Protein: 5.8 g/dL — ABNORMAL LOW (ref 6.5–8.1)

## 2019-06-07 LAB — URINALYSIS, ROUTINE W REFLEX MICROSCOPIC
Bilirubin Urine: NEGATIVE
Glucose, UA: NEGATIVE mg/dL
Hgb urine dipstick: NEGATIVE
Ketones, ur: NEGATIVE mg/dL
Leukocytes,Ua: NEGATIVE
Nitrite: NEGATIVE
Protein, ur: NEGATIVE mg/dL
Specific Gravity, Urine: 1.002 — ABNORMAL LOW (ref 1.005–1.030)
pH: 7 (ref 5.0–8.0)

## 2019-06-07 LAB — CBC WITH DIFFERENTIAL/PLATELET
Abs Immature Granulocytes: 0.03 10*3/uL (ref 0.00–0.07)
Basophils Absolute: 0 10*3/uL (ref 0.0–0.1)
Basophils Relative: 0 %
Eosinophils Absolute: 0.2 10*3/uL (ref 0.0–0.5)
Eosinophils Relative: 2 %
HCT: 36.9 % (ref 36.0–46.0)
Hemoglobin: 12.1 g/dL (ref 12.0–15.0)
Immature Granulocytes: 0 %
Lymphocytes Relative: 27 %
Lymphs Abs: 2.1 10*3/uL (ref 0.7–4.0)
MCH: 27.3 pg (ref 26.0–34.0)
MCHC: 32.8 g/dL (ref 30.0–36.0)
MCV: 83.3 fL (ref 80.0–100.0)
Monocytes Absolute: 0.7 10*3/uL (ref 0.1–1.0)
Monocytes Relative: 9 %
Neutro Abs: 4.7 10*3/uL (ref 1.7–7.7)
Neutrophils Relative %: 62 %
Platelets: 377 10*3/uL (ref 150–400)
RBC: 4.43 MIL/uL (ref 3.87–5.11)
RDW: 14.6 % (ref 11.5–15.5)
WBC: 7.7 10*3/uL (ref 4.0–10.5)
nRBC: 0 % (ref 0.0–0.2)

## 2019-06-07 LAB — URIC ACID: Uric Acid, Serum: 6.6 mg/dL (ref 2.5–7.1)

## 2019-06-07 LAB — LACTATE DEHYDROGENASE: LDH: 170 U/L (ref 98–192)

## 2019-06-07 LAB — GROUP B STREP BY PCR: Group B strep by PCR: POSITIVE — AB

## 2019-06-07 LAB — PROTEIN / CREATININE RATIO, URINE
Creatinine, Urine: 34.85 mg/dL
Total Protein, Urine: <6 mg/dL

## 2019-06-07 MED ORDER — LABETALOL HCL 5 MG/ML IV SOLN: 40.0000 mg | INTRAVENOUS | Status: DC | PRN

## 2019-06-07 MED ORDER — LABETALOL HCL 5 MG/ML IV SOLN: 20.0000 mg | INTRAVENOUS | Status: DC | PRN

## 2019-06-07 MED ORDER — HYDRALAZINE HCL 20 MG/ML IJ SOLN: 10.0000 mg | INTRAMUSCULAR | Status: DC | PRN

## 2019-06-07 MED ORDER — BETAMETHASONE SOD PHOS & ACET 6 (3-3) MG/ML IJ SUSP
12.0000 mg | INTRAMUSCULAR | Status: AC
  Administered 2019-06-07 – 2019-06-08 (×2): 12 mg via INTRAMUSCULAR
  Filled 2019-06-07 (×2): qty 5

## 2019-06-07 MED ORDER — BETAMETHASONE SOD PHOS & ACET 6 (3-3) MG/ML IJ SUSP: 12.0000 mg | INTRAMUSCULAR | Status: DC

## 2019-06-07 MED ORDER — LABETALOL HCL 5 MG/ML IV SOLN: 80.0000 mg | INTRAVENOUS | Status: DC | PRN

## 2019-06-07 MED ORDER — ASPIRIN 81 MG PO CHEW
81.0000 mg | CHEWABLE_TABLET | Freq: Once | ORAL | Status: AC
  Administered 2019-06-08: 09:00:00 81 mg via ORAL
  Filled 2019-06-07: qty 1

## 2019-06-07 MED ORDER — LACTATED RINGERS IV SOLN: INTRAVENOUS | Status: DC

## 2019-06-07 NOTE — H&P (Signed)
OB ADMISSION/ HISTORY & PHYSICAL:  Admission Date: 06/07/2019  5:54 PM  Admit Diagnosis: Gestational hypertension  Bianca Smith, 39 y.o., G1P0, 62w5dIUP as a result of embryo transfer. Presents to MAU after being seen in office w/ elevated BP. Negative Pre-ecl work-up on 05/31/19. Here for repeat labs and serial BP today. Denies visual changes, HA, and epigastric pain. Reports active FM, denies ctx. Currently being seen for antenatal testing twice weekly for AMA. I saw this pt in MAU and she was tearful, but cooperative, and stated that she was willing to whatever was best for her and the baby. Pt later texted Dr. RMancel Balethat she needed to be induced or to have a C-Section. Per Dr. RMancel Bale pt agreed to submit to testing and further eval to determine delivery time and mode.   History of current pregnancy: G1P0   Primary Ob Provider: Dr. RHerbie BaltimorePatient entered care with [facility] at 10+5 wks.   EDC of 07/14/19 was established by embryo transfer date and congruent w/ 9+0 wk U/S.   Anatomy scan: 19+5 wks, with normal findings and anterior placenta.   Antenatal testing: for AMA started at 32 weeks Last evaluation: 34+5 wks, BPP 8/8, normal AF, EFW 5# 4oz   Significant prenatal events: AMA, Alpha thalassemia trait, Vit D deficiency, Obese, Hx of myomectomy   Patient Active Problem List   Diagnosis Date Noted  . Gestational hypertension w/o significant proteinuria in 3rd trimester 06/07/2019  . Thrombocytosis ([place] 06/07/2019  . HSV infection 06/07/2019  . History of loop electrosurgical excision procedure (LEEP) 06/07/2019  . Irritable bowel syndrome (IBS) 06/07/2019  . Menometrorrhagia 06/07/2019  . Alpha thalassemia trait 06/07/2019  . AMA (advanced maternal age) primigravida 39+ third trimester 06/07/2019  . Vitamin D deficiency 06/07/2019  . Drug reaction 04/08/2017  . Migraine without aura and with status migrainosus, not intractable 07/08/2016  . Morbid obesity ([place] 04/08/2016  . Sleep  related headaches 04/08/2016  . Snoring 04/08/2016  . OSA (obstructive sleep apnea) 04/08/2016  . Intractable chronic migraine without aura and with status migrainosus 04/08/2016  . Persistent headaches 04/08/2016  . Fibroid, uterine 10/11/2015    Prenatal Labs: ABO, Rh:  A positive Antibody:  Negative Rubella:  Immune RPR:   non-reactive HBsAg:   negative HIV:   non-reactive GTT: passed GBS: positive GC/CHL: negative Genetics: low-risk female Vaccines: Flu and Tdap current   OB History  Gravida Para Term Preterm AB Living  1            SAB TAB Ectopic Multiple Live Births               # Outcome Date GA Lbr Len/2nd Weight Sex Delivery Anes PTL Lv  1 Current             Medical / Surgical History: Past medical history:  Past Medical History:  Diagnosis Date  . Complication of anesthesia    crying  . Thrombocytosis ([place]     Past surgical history:  Past Surgical History:  Procedure Laterality Date  . BONE MARROW BIOPSY    . HYSTEROSCOPY    . IUD REMOVAL    . ROBOT ASSISTED MYOMECTOMY N/A 10/11/2015   Procedure: ROBOTIC ASSISTED MYOMECTOMY;  Surgeon: SDelsa Bern MD;  [facility];  Service: Gynecology;  Laterality: N/A;  with power morcellation   . WISDOM TOOTH EXTRACTION     Family History: No family history on file.  Social History:  reports that she has never smoked. She has  never used smokeless tobacco. She reports previous alcohol use. She reports that she does not use drugs.  Allergies: Patient has no known allergies.   Current Medications at time of admission:  Prior to Admission medications   Medication Sig Start Date End Date Taking? Authorizing Provider  cholecalciferol (VITAMIN D3) 25 MCG (1000 UNIT) tablet Take 2,000 Units by mouth daily.   Yes [provider]  docusate sodium (COLACE) 100 MG capsule Take 100 mg by mouth daily.   Yes [provider]  linaclotide (LINZESS) 290 MCG CAPS capsule Take 290 mcg by mouth daily  before breakfast.   Yes [provider]  Prenatal Vit-Fe Fumarate-FA (PRENATAL MULTIVITAMIN) TABS tablet Take 1 tablet by mouth daily at 12 noon.   Yes [provider]  metoprolol succinate (TOPROL-XL) 25 MG 24 hr tablet TAKE 1 TABLET(25 MG) BY MOUTH DAILY AT NIGHT 10/03/16   Dohmeier, Asencion Partridge, MD  sertraline (ZOLOFT) 25 MG tablet Take 25 mg by mouth 2 (two) times daily.    [provider]    Review of Systems: Constitutional: Negative   HENT: Negative   Eyes: Negative   Respiratory: Negative   Cardiovascular: Negative   Gastrointestinal: Negative  Genitourinary: negative for bloody show, negative for LOF   Musculoskeletal: Negative   Skin: Negative   Neurological: Negative   Endo/Heme/Allergies: Negative   Psychiatric/Behavioral: Negative    Physical Exam: VS: Blood pressure (!) 145/79, pulse 86, temperature 98.7 F (37.1 C), temperature source Oral, resp. rate 18, height _0  (1.626 m), weight 105.2 kg, SpO2 100 %.   AAO x3, no signs of distress GU/GI: Abdomen gravid, non-tender, non-distended, active FM, vertex Extremities: 1+ edema, negative for pain, tenderness, and cords  Cervical exam: deferred, not in labor FHR: baseline rate 145 / variability moderate / accelerations present / absent decelerations TOCO: None, abd palpates soft CBC    Component Value Date/Time   WBC 7.7 06/07/2019 2005   RBC 4.43 06/07/2019 2005   HGB 12.1 06/07/2019 2005   HGB 13.4 09/09/2014 1331   HCT 36.9 06/07/2019 2005   HCT 39.7 09/09/2014 1331   PLT 377 06/07/2019 2005   PLT 556 (H) 09/09/2014 1331   MCV 83.3 06/07/2019 2005   MCV 81 09/09/2014 1331   MCH 27.3 06/07/2019 2005   MCHC 32.8 06/07/2019 2005   RDW 14.6 06/07/2019 2005   RDW 13.7 09/09/2014 1331   LYMPHSABS 2.1 06/07/2019 2005   LYMPHSABS 2.6 09/09/2014 1331   MONOABS 0.7 06/07/2019 2005   EOSABS 0.2 06/07/2019 2005   EOSABS 0.3 09/09/2014 1331   BASOSABS 0.0 06/07/2019 2005   BASOSABS 0.0  09/09/2014 1331   CMP     Component Value Date/Time   NA 137 06/07/2019 2005   NA 140 04/08/2016 1621   K 4.2 06/07/2019 2005   CL 107 06/07/2019 2005   CO2 20 (L) 06/07/2019 2005   GLUCOSE 76 06/07/2019 2005   BUN 6 06/07/2019 2005   BUN 11 04/08/2016 1621   CREATININE 1.11 (H) 06/07/2019 2005   CALCIUM 9.7 06/07/2019 2005   PROT 5.8 (L) 06/07/2019 2005   PROT 7.0 04/08/2016 1621   ALBUMIN 2.6 (L) 06/07/2019 2005   ALBUMIN 4.1 04/08/2016 1621   AST 19 06/07/2019 2005   ALT 14 06/07/2019 2005   ALKPHOS 100 06/07/2019 2005   BILITOT 0.4 06/07/2019 2005   BILITOT 0.2 04/08/2016 1621   GFRNONAA >60 06/07/2019 2005   GFRAA >60 06/07/2019 2005   PCR-too low to calculate  Prenatal Transfer Tool  Maternal Diabetes: No Genetic Screening: Normal Maternal Ultrasounds/Referrals: Normal Fetal Ultrasounds or other Referrals:  None Maternal Substance Abuse:  No Significant Maternal Medications:  Meds include: Zoloft Other: Linzess, Baby ASA, Toprol Significant Maternal Lab Results: Group B Strep positive    Assessment: 39 y.o. G1P0 29w5dIUP via embryonic transfer Gestational hypertension R/O Pre-eclampsia FHR category 1 GBS positive   Plan:  23 hour Obs Repeat labs @ 0200 MFM consult    -Dr. RMancel Baleto Dr. FAnnamaria Boots   -recc antenatal steroids, 24 hr urine, delivery if BP/creatine/Pre-ecl lab values continue to increase  Plan developed w/ Dr. RMancel Bale   VArrie EasternCNM, MSN 06/07/2019 10:22 PM

## 2019-06-07 NOTE — MAU Note (Signed)
Sent over for further eval, BP was up today. New problem.  Denies HA, visual  Changes, or epigastric pain.  States legs and fingers are swollen.

## 2019-06-08 LAB — CBC WITH DIFFERENTIAL/PLATELET
Abs Immature Granulocytes: 0.04 10*3/uL (ref 0.00–0.07)
Basophils Absolute: 0 10*3/uL (ref 0.0–0.1)
Basophils Relative: 0 %
Eosinophils Absolute: 0 10*3/uL (ref 0.0–0.5)
Eosinophils Relative: 0 %
HCT: 34.2 % — ABNORMAL LOW (ref 36.0–46.0)
Hemoglobin: 11.3 g/dL — ABNORMAL LOW (ref 12.0–15.0)
Immature Granulocytes: 1 %
Lymphocytes Relative: 13 %
Lymphs Abs: 1 10*3/uL (ref 0.7–4.0)
MCH: 27.4 pg (ref 26.0–34.0)
MCHC: 33 g/dL (ref 30.0–36.0)
MCV: 82.8 fL (ref 80.0–100.0)
Monocytes Absolute: 0.3 10*3/uL (ref 0.1–1.0)
Monocytes Relative: 3 %
Neutro Abs: 6.7 10*3/uL (ref 1.7–7.7)
Neutrophils Relative %: 83 %
Platelets: 348 10*3/uL (ref 150–400)
RBC: 4.13 MIL/uL (ref 3.87–5.11)
RDW: 14.6 % (ref 11.5–15.5)
WBC: 8.1 10*3/uL (ref 4.0–10.5)
nRBC: 0 % (ref 0.0–0.2)

## 2019-06-08 LAB — COMPREHENSIVE METABOLIC PANEL
ALT: 13 U/L (ref 0–44)
AST: 18 U/L (ref 15–41)
Albumin: 2.4 g/dL — ABNORMAL LOW (ref 3.5–5.0)
Alkaline Phosphatase: 105 U/L (ref 38–126)
Anion gap: 8 (ref 5–15)
BUN: 7 mg/dL (ref 6–20)
CO2: 18 mmol/L — ABNORMAL LOW (ref 22–32)
Calcium: 9.3 mg/dL (ref 8.9–10.3)
Chloride: 110 mmol/L (ref 98–111)
Creatinine, Ser: 0.99 mg/dL (ref 0.44–1.00)
GFR calc Af Amer: >60 mL/min (ref >60)
GFR calc non Af Amer: >60 mL/min (ref >60)
Glucose, Bld: 147 mg/dL — ABNORMAL HIGH (ref 70–99)
Potassium: 4.1 mmol/L (ref 3.5–5.1)
Sodium: 136 mmol/L (ref 135–145)
Total Bilirubin: 0.2 mg/dL — ABNORMAL LOW (ref 0.3–1.2)
Total Protein: 5.6 g/dL — ABNORMAL LOW (ref 6.5–8.1)

## 2019-06-08 LAB — SARS CORONAVIRUS 2 (TAT 6-24 HRS): SARS Coronavirus 2: NEGATIVE

## 2019-06-08 MED ORDER — ZOLPIDEM TARTRATE 5 MG PO TABS
5.0000 mg | ORAL_TABLET | Freq: Once | ORAL | Status: AC
  Administered 2019-06-08: 5 mg via ORAL
  Filled 2019-06-08: qty 1

## 2019-06-08 NOTE — Progress Notes (Addendum)
ANTEPARTUM COMPREHENSIVE PROGRESS NOTE  Bianca Smith, 39 year old G1P0 at [redacted]w[redacted]d IUP as a result of embryo transfer. Entered care with [facility] at 10.5 weeks EDC 07/14/19 established by embryo transfer date Anatomy US WNL Antenatal twice weekly due to [facility] Last evaluation: 34+5 wks, BPP 8/8, normal AF, EFW 5# 4oz  Significant prenatal events: AMA, Alpha thalassemia trait, Vit D deficiency, Obese, Hx of myomectomy  Presented to MAU after being seen in office w/ elevated BP Negative HTN labs on 05/31/19  Patient Active Problem List   Diagnosis Date Noted  . Gestational hypertension w/o significant proteinuria in 3rd trimester 06/07/2019  . Thrombocytosis ([place]) 06/07/2019  . HSV infection 06/07/2019  . History of loop electrosurgical excision procedure (LEEP) 06/07/2019  . Irritable bowel syndrome (IBS) 06/07/2019  . Menometrorrhagia 06/07/2019  . Alpha thalassemia trait 06/07/2019  . AMA (advanced maternal age) primigravida 52+, third trimester 06/07/2019  . Vitamin D deficiency 06/07/2019  . Drug reaction 04/08/2017  . Migraine without aura and with status migrainosus, not intractable 07/08/2016  . Morbid obesity ([place]) 04/08/2016  . Sleep related headaches 04/08/2016  . Snoring 04/08/2016  . OSA (obstructive sleep apnea) 04/08/2016  . Intractable chronic migraine without aura and with status migrainosus 04/08/2016  . Persistent headaches 04/08/2016  . Fibroid, uterine 10/11/2015    Subjective: Denies headache, blurred vision, epigastric pain, visual disturbance Denies contractions, vb, or lof.  Patient reports good fetal movement.   Vitals:  Blood pressure (!) 142/81, pulse 72, temperature 98.1 F (36.7 C), temperature source Oral, resp. rate 18, height 5\' 4"  (1.626 m), weight 105.2 kg, SpO2 100 %.   ABO, Rh: A positive  Antibody: Negative  Rubella: Immune  RPR: non-reactive  HBsAg: negative  HIV: non-reactive  GTT: passed  GBS: positive  GC/CHL: negative  Genetics:  low-risk female  Vaccines: Flu and Tdap current   Physical Examination: General appearance - alert, well appearing, and in no distress and oriented to person, place, and time Mental status - alert, oriented to person, place, and time, normal mood, behavior, speech, dress, motor activity, and thought processes, affect appropriate to mood Chest - clear to auscultation, no wheezes, rales or rhonchi, symmetric air entry, no tachypnea, retractions or cyanosis Heart - normal rate, regular rhythm, normal S1, S2, no murmurs, rubs, clicks or gallops Abdomen - soft, nontender, nondistended, no masses or organomegaly Gravid Pelvic - normal external genitalia, vulva, vagina, cervix, uterus and adnexa, examination not indicated Cervical Exam: Not evaluated.  Extremities: extremities normal, atraumatic, no cyanosis or edema, Homans sign is negative, no sign of DVT and no edema, redness or tenderness in the calves or thighs with DTRs 1+ bilaterally  Fetal Monitoring:  Baseline: 150 bpm, Variability: Moderate, Accelerations: Reactive, Decelerations: Absent and Toco Quiet  Reactive NST Cat I tracing  Labs:  Results for orders placed or performed during the hospital encounter of 06/07/19 (from the past 24 hour(s))  Protein / creatinine ratio, urine   Collection Time: 06/07/19  6:15 PM  Result Value Ref Range   Creatinine, Urine 34.85 mg/dL   Total Protein, Urine <6 mg/dL   Protein Creatinine Ratio        0.00 - 0.15 mg/mg[Cre]  Urinalysis, Routine w reflex microscopic   Collection Time: 06/07/19  6:15 PM  Result Value Ref Range   Color, Urine STRAW (A) YELLOW   APPearance CLEAR CLEAR   Specific Gravity, Urine 1.002 (L) 1.005 - 1.030   pH 7.0 5.0 - 8.0   Glucose, UA  NEGATIVE NEGATIVE mg/dL   Hgb urine dipstick NEGATIVE NEGATIVE   Bilirubin Urine NEGATIVE NEGATIVE   Ketones, ur NEGATIVE NEGATIVE mg/dL   Protein, ur NEGATIVE NEGATIVE mg/dL   Nitrite NEGATIVE NEGATIVE   Leukocytes,Ua NEGATIVE NEGATIVE   Comprehensive metabolic panel   Collection Time: 06/07/19  8:05 PM  Result Value Ref Range   Sodium 137 135 - 145 mmol/L   Potassium 4.2 3.5 - 5.1 mmol/L   Chloride 107 98 - 111 mmol/L   CO2 20 (L) 22 - 32 mmol/L   Glucose, Bld 76 70 - 99 mg/dL   BUN 6 6 - 20 mg/dL   Creatinine, Ser 1.11 (H) 0.44 - 1.00 mg/dL   Calcium 9.7 8.9 - 10.3 mg/dL   Total Protein 5.8 (L) 6.5 - 8.1 g/dL   Albumin 2.6 (L) 3.5 - 5.0 g/dL   AST 19 15 - 41 U/L   ALT 14 0 - 44 U/L   Alkaline Phosphatase 100 38 - 126 U/L   Total Bilirubin 0.4 0.3 - 1.2 mg/dL   GFR calc non Af Amer >60 >60 mL/min   GFR calc Af Amer >60 >60 mL/min   Anion gap 10 5 - 15  CBC with Differential   Collection Time: 06/07/19  8:05 PM  Result Value Ref Range   WBC 7.7 4.0 - 10.5 K/uL   RBC 4.43 3.87 - 5.11 MIL/uL   Hemoglobin 12.1 12.0 - 15.0 g/dL   HCT 36.9 36.0 - 46.0 %   MCV 83.3 80.0 - 100.0 fL   MCH 27.3 26.0 - 34.0 pg   MCHC 32.8 30.0 - 36.0 g/dL   RDW 14.6 11.5 - 15.5 %   Platelets 377 150 - 400 K/uL   nRBC 0.0 0.0 - 0.2 %   Neutrophils Relative % 62 %   Neutro Abs 4.7 1.7 - 7.7 K/uL   Lymphocytes Relative 27 %   Lymphs Abs 2.1 0.7 - 4.0 K/uL   Monocytes Relative 9 %   Monocytes Absolute 0.7 0.1 - 1.0 K/uL   Eosinophils Relative 2 %   Eosinophils Absolute 0.2 0.0 - 0.5 K/uL   Basophils Relative 0 %   Basophils Absolute 0.0 0.0 - 0.1 K/uL   Immature Granulocytes 0 %   Abs Immature Granulocytes 0.03 0.00 - 0.07 K/uL  Lactate dehydrogenase   Collection Time: 06/07/19  8:27 PM  Result Value Ref Range   LDH 170 98 - 192 U/L  Uric acid   Collection Time: 06/07/19  8:27 PM  Result Value Ref Range   Uric Acid, Serum 6.6 2.5 - 7.1 mg/dL  Group B strep by PCR   Collection Time: 06/07/19  9:35 PM   Specimen: Vaginal/Rectal; Genital  Result Value Ref Range   Group B strep by PCR POSITIVE (A) NEGATIVE  SARS CORONAVIRUS 2 (TAT 6-24 HRS) Nasopharyngeal Nasopharyngeal Swab   Collection Time: 06/07/19  9:58 PM   Specimen:  Nasopharyngeal Swab  Result Value Ref Range   SARS Coronavirus 2 NEGATIVE NEGATIVE  Comprehensive metabolic panel   Collection Time: 06/08/19  2:11 AM  Result Value Ref Range   Sodium 136 135 - 145 mmol/L   Potassium 4.1 3.5 - 5.1 mmol/L   Chloride 110 98 - 111 mmol/L   CO2 18 (L) 22 - 32 mmol/L   Glucose, Bld 147 (H) 70 - 99 mg/dL   BUN 7 6 - 20 mg/dL   Creatinine, Ser 0.99 0.44 - 1.00 mg/dL   Calcium 9.3 8.9 - 10.3 mg/dL  Total Protein 5.6 (L) 6.5 - 8.1 g/dL   Albumin 2.4 (L) 3.5 - 5.0 g/dL   AST 18 15 - 41 U/L   ALT 13 0 - 44 U/L   Alkaline Phosphatase 105 38 - 126 U/L   Total Bilirubin 0.2 (L) 0.3 - 1.2 mg/dL   GFR calc non Af Amer >60 >60 mL/min   GFR calc Af Amer >60 >60 mL/min   Anion gap 8 5 - 15  CBC with Differential/Platelet   Collection Time: 06/08/19  2:11 AM  Result Value Ref Range   WBC 8.1 4.0 - 10.5 K/uL   RBC 4.13 3.87 - 5.11 MIL/uL   Hemoglobin 11.3 (L) 12.0 - 15.0 g/dL   HCT 34.2 (L) 36.0 - 46.0 %   MCV 82.8 80.0 - 100.0 fL   MCH 27.4 26.0 - 34.0 pg   MCHC 33.0 30.0 - 36.0 g/dL   RDW 14.6 11.5 - 15.5 %   Platelets 348 150 - 400 K/uL   nRBC 0.0 0.0 - 0.2 %   Neutrophils Relative % 83 %   Neutro Abs 6.7 1.7 - 7.7 K/uL   Lymphocytes Relative 13 %   Lymphs Abs 1.0 0.7 - 4.0 K/uL   Monocytes Relative 3 %   Monocytes Absolute 0.3 0.1 - 1.0 K/uL   Eosinophils Relative 0 %   Eosinophils Absolute 0.0 0.0 - 0.5 K/uL   Basophils Relative 0 %   Basophils Absolute 0.0 0.0 - 0.1 K/uL   Immature Granulocytes 1 %   Abs Immature Granulocytes 0.04 0.00 - 0.07 K/uL     Medications:  Scheduled . betamethasone acetate-betamethasone sodium phosphate  12 mg Intramuscular Q24 Hr x 2     ASSESSMENT/PLAN Preliminary POC discussed with Dr. Alesia Richards 39 y.o. G1P0 [redacted]w[redacted]d IUP via embryonic transfer  Gestational hypertension  R/O Pre-eclampsia  FHR category 1  GBS positive  Complete 2nd dose of BMZ at 2100  24 hour urine in progress, complete 06/09/19 @ 0200  MFM  consult completed -Dr. Mancel Bale to Dr. Annamaria Boots  Consider DC and outpatient mgmt tomorrow if Bps and labs stable    Beatrix Fetters, CNM 06/08/2019,9:22 AM   Attestation of Attending Supervision of Advanced Practitioner (CNM/NP): Evaluation and management procedures were performed by the Advanced Practitioner under my supervision and collaboration.  I have reviewed the Advanced Practitioner's note and chart, and I agree with the management and plan. I saw and examined patient at bedside and agree with above findings, assessment and plan as outlined above by [facility].  Will follow up on preeclampsia labs in the morning including 24 hr urine for protein, cr clearance, continue with close BP follow up, for second betamethasone injection in the morning.  Recent Bps reviewed:  Vitals:   06/08/19 0609 06/08/19 0822 06/08/19 1134 06/08/19 1500  BP: 138/70 (!) 142/81 (!) 155/90 (!) 154/83  Pulse: 77 72 86 82  Temp: 97.8 F (36.6 C) 98.1 F (36.7 C) 98.2 F (36.8 C) 98.3 F (36.8 C)  Resp: 18 18 18 16   Height:      Weight:      SpO2: 100% 100% 99%   TempSrc: Oral Oral Oral Oral  BMI (Calculated):      Dr. Alesia Richards.  06/08/2019  1747.

## 2019-06-09 ENCOUNTER — Encounter (HOSPITAL_COMMUNITY): Payer: Self-pay | Admitting: Obstetrics & Gynecology

## 2019-06-09 ENCOUNTER — Observation Stay (HOSPITAL_COMMUNITY): Payer: No Typology Code available for payment source

## 2019-06-09 DIAGNOSIS — O09513 Supervision of elderly primigravida, third trimester: Secondary | ICD-10-CM

## 2019-06-09 DIAGNOSIS — Z3A35 35 weeks gestation of pregnancy: Secondary | ICD-10-CM

## 2019-06-09 DIAGNOSIS — O4100X0 Oligohydramnios, unspecified trimester, not applicable or unspecified: Secondary | ICD-10-CM | POA: Diagnosis not present

## 2019-06-09 DIAGNOSIS — O133 Gestational [pregnancy-induced] hypertension without significant proteinuria, third trimester: Secondary | ICD-10-CM

## 2019-06-09 LAB — DIFFERENTIAL
Abs Immature Granulocytes: 0.25 10*3/uL — ABNORMAL HIGH (ref 0.00–0.07)
Basophils Absolute: 0 10*3/uL (ref 0.0–0.1)
Basophils Relative: 0 %
Eosinophils Absolute: 0 10*3/uL (ref 0.0–0.5)
Eosinophils Relative: 0 %
Immature Granulocytes: 2 %
Lymphocytes Relative: 11 %
Lymphs Abs: 1.4 10*3/uL (ref 0.7–4.0)
Monocytes Absolute: 1.2 10*3/uL — ABNORMAL HIGH (ref 0.1–1.0)
Monocytes Relative: 10 %
Neutro Abs: 9.3 10*3/uL — ABNORMAL HIGH (ref 1.7–7.7)
Neutrophils Relative %: 77 %

## 2019-06-09 LAB — CBC
HCT: 33.4 % — ABNORMAL LOW (ref 36.0–46.0)
HCT: 34 % — ABNORMAL LOW (ref 36.0–46.0)
Hemoglobin: 11.1 g/dL — ABNORMAL LOW (ref 12.0–15.0)
Hemoglobin: 11.1 g/dL — ABNORMAL LOW (ref 12.0–15.0)
MCH: 27.8 pg (ref 26.0–34.0)
MCH: 27.5 pg (ref 26.0–34.0)
MCHC: 33.2 g/dL (ref 30.0–36.0)
MCHC: 32.6 g/dL (ref 30.0–36.0)
MCV: 83.5 fL (ref 80.0–100.0)
MCV: 84.4 fL (ref 80.0–100.0)
Platelets: 356 10*3/uL (ref 150–400)
Platelets: 380 10*3/uL (ref 150–400)
RBC: 4 MIL/uL (ref 3.87–5.11)
RBC: 4.03 MIL/uL (ref 3.87–5.11)
RDW: 14.4 % (ref 11.5–15.5)
RDW: 14.6 % (ref 11.5–15.5)
WBC: 12.9 10*3/uL — ABNORMAL HIGH (ref 4.0–10.5)
WBC: 12.2 10*3/uL — ABNORMAL HIGH (ref 4.0–10.5)
nRBC: 0.2 % (ref 0.0–0.2)
nRBC: 0.2 % (ref 0.0–0.2)

## 2019-06-09 LAB — COMPREHENSIVE METABOLIC PANEL
ALT: 13 U/L (ref 0–44)
AST: 18 U/L (ref 15–41)
Albumin: 2.4 g/dL — ABNORMAL LOW (ref 3.5–5.0)
Alkaline Phosphatase: 98 U/L (ref 38–126)
Anion gap: 10 (ref 5–15)
BUN: 9 mg/dL (ref 6–20)
CO2: 19 mmol/L — ABNORMAL LOW (ref 22–32)
Calcium: 8.8 mg/dL — ABNORMAL LOW (ref 8.9–10.3)
Chloride: 108 mmol/L (ref 98–111)
Creatinine, Ser: 0.98 mg/dL (ref 0.44–1.00)
GFR calc Af Amer: >60 mL/min (ref >60)
GFR calc non Af Amer: >60 mL/min (ref >60)
Glucose, Bld: 125 mg/dL — ABNORMAL HIGH (ref 70–99)
Potassium: 4.4 mmol/L (ref 3.5–5.1)
Sodium: 137 mmol/L (ref 135–145)
Total Bilirubin: 0.3 mg/dL (ref 0.3–1.2)
Total Protein: 5.5 g/dL — ABNORMAL LOW (ref 6.5–8.1)

## 2019-06-09 LAB — ABO/RH: ABO/RH(D): A POS

## 2019-06-09 LAB — PROTEIN, URINE, 24 HOUR
Collection Interval-UPROT: 24 hours
Protein, Urine: <6 mg/dL
Urine Total Volume-UPROT: 2600 mL

## 2019-06-09 LAB — TYPE AND SCREEN
ABO/RH(D): A POS
Antibody Screen: NEGATIVE

## 2019-06-09 MED ORDER — MORPHINE SULFATE (PF) 0.5 MG/ML IJ SOLN
INTRAMUSCULAR | Status: AC
  Filled 2019-06-09: qty 10

## 2019-06-09 MED ORDER — ZOLPIDEM TARTRATE 5 MG PO TABS: 5.0000 mg | ORAL_TABLET | Freq: Once | ORAL | Status: DC

## 2019-06-09 MED ORDER — SOD CITRATE-CITRIC ACID 500-334 MG/5ML PO SOLN
30.0000 mL | ORAL | Status: AC
  Administered 2019-06-09: 30 mL via ORAL
  Filled 2019-06-09: qty 30

## 2019-06-09 MED ORDER — SOD CITRATE-CITRIC ACID 500-334 MG/5ML PO SOLN
30.0000 mL | Freq: Once | ORAL | Status: AC
  Administered 2019-06-09: 30 mL via ORAL
  Filled 2019-06-09: qty 30

## 2019-06-09 MED ORDER — LINACLOTIDE 145 MCG PO CAPS
290.0000 ug | ORAL_CAPSULE | Freq: Every day | ORAL | Status: DC
  Administered 2019-06-10 – 2019-06-13 (×4): 290 ug via ORAL
  Filled 2019-06-09: qty 1
  Filled 2019-06-09 (×5): qty 2

## 2019-06-09 MED ORDER — PHENYLEPHRINE HCL-NACL 20-0.9 MG/250ML-% IV SOLN
INTRAVENOUS | Status: AC
  Filled 2019-06-09: qty 250

## 2019-06-09 MED ORDER — DEXAMETHASONE SODIUM PHOSPHATE 10 MG/ML IJ SOLN
INTRAMUSCULAR | Status: AC
  Filled 2019-06-09: qty 1

## 2019-06-09 MED ORDER — OXYTOCIN 40 UNITS IN NORMAL SALINE INFUSION - SIMPLE MED
INTRAVENOUS | Status: AC
  Filled 2019-06-09: qty 1000

## 2019-06-09 MED ORDER — SERTRALINE HCL 50 MG PO TABS
25.0000 mg | ORAL_TABLET | Freq: Two times a day (BID) | ORAL | Status: DC
  Filled 2019-06-09 (×4): qty 1

## 2019-06-09 MED ORDER — LACTATED RINGERS IV SOLN: INTRAVENOUS | Status: DC

## 2019-06-09 MED ORDER — FENTANYL CITRATE (PF) 100 MCG/2ML IJ SOLN
INTRAMUSCULAR | Status: AC
  Filled 2019-06-09: qty 2

## 2019-06-09 MED ORDER — CEFAZOLIN SODIUM-DEXTROSE 2-4 GM/100ML-% IV SOLN
2.0000 g | INTRAVENOUS | Status: AC
  Administered 2019-06-10: 2 g via INTRAVENOUS
  Filled 2019-06-09: qty 100

## 2019-06-09 MED ORDER — ONDANSETRON HCL 4 MG/2ML IJ SOLN
INTRAMUSCULAR | Status: AC
  Filled 2019-06-09: qty 2

## 2019-06-09 NOTE — Consult Note (Addendum)
MFM Note  An ultrasound performed this afternoon showed an EFW of 5 pounds 3 ounces.   Oligohydramnios with a total AFI of only about 3 cm was noted.    Fetal movements and fetal breathing movements were noted throughout today's ultrasound exam.    Due to oligohydramnios noted on today's ultrasound, we will cancel the patient's planned discharge.    Due to her elevated blood pressures and oligohydramnios at her current gestational age, delivery is recommended.

## 2019-06-09 NOTE — Anesthesia Preprocedure Evaluation (Signed)
Anesthesia Evaluation  Patient identified by MRN, date of birth, ID band Patient awake    Reviewed: Allergy & Precautions, NPO status , Patient's Chart, lab work & pertinent test results  History of Anesthesia Complications (+) history of anesthetic complications  Airway Mallampati: III  TM Distance: >3 FB Neck ROM: Full    Dental no notable dental hx. (+) Teeth Intact   Pulmonary sleep apnea and Continuous Positive Airway Pressure Ventilation ,    Pulmonary exam normal breath sounds clear to auscultation       Cardiovascular hypertension, Normal cardiovascular exam Rhythm:Regular Rate:Normal     Neuro/Psych  Headaches, negative psych ROS   GI/Hepatic Neg liver ROS, GERD  ,IBS   Endo/Other  Morbid obesity  Renal/GU negative Renal ROS  negative genitourinary   Musculoskeletal negative musculoskeletal ROS (+)   Abdominal (+) + obese,   Peds  Hematology  (+) anemia , Hx/o thrombocytosis   Anesthesia Other Findings   Reproductive/Obstetrics (+) Pregnancy Hx/o Myomectomy IVF HSV AMA Oligohydramnios 37 weeks                             Anesthesia Physical Anesthesia Plan  ASA: III and emergent  Anesthesia Plan: Spinal   Post-op Pain Management:    Induction:   PONV Risk Score and Plan: 4 or greater and Scopolamine patch - Pre-op, Ondansetron, Treatment may vary due to age or medical condition and Dexamethasone  Airway Management Planned: Natural Airway  Additional Equipment:   Intra-op Plan:   Post-operative Plan:   Informed Consent: I have reviewed the patients History and Physical, chart, labs and discussed the procedure including the risks, benefits and alternatives for the proposed anesthesia with the patient or authorized representative who has indicated his/her understanding and acceptance.     Dental advisory given  Plan Discussed with: CRNA and  Surgeon  Anesthesia Plan Comments:         Anesthesia Quick Evaluation

## 2019-06-09 NOTE — Discharge Summary (Signed)
Discharge Summary  Patient ID: Bianca Smith MRN: [id number] DOB/AGE: October 27, 1980 39 y.o.  Admit date: 06/07/2019 Discharge date: 06/09/2019  Admission Diagnoses: Gestational Hypertension  Discharge Diagnoses:  Active Problems:   Gestational hypertension w/o significant proteinuria in 3rd trimester   Thrombocytosis ([facility])   HSV infection   History of loop electrosurgical excision procedure (LEEP)   Irritable bowel syndrome (IBS)   Menometrorrhagia   Alpha thalassemia trait   AMA (advanced maternal age) primigravida 38+, third trimester   Vitamin D deficiency   Discharged Condition: good  Hospital Course: Admitted for elevated BP in office, repeat labs, and extended monitoring. Treated w/ IV fluids and antenatal steroids. Pre-eclampsia labs and 24 hour urine collected w/ negative results except urine creatinine. Urine creatinine 1.1 on admission, 0.98 at time of discharge.   Consults: Maternal Fetal Medicine  Significant Diagnostic Studies: labs: creatinine  Treatments: IV hydration, steroids: betamethasone and procedures: ultrasound, external fetal monitor  Discharge Exam: Blood pressure (!) 146/78, pulse 99, temperature 98 F (36.7 C), temperature source Oral, resp. rate 18, height 5\' 4"  (1.626 m), weight 105.2 kg, SpO2 98 %. General appearance: alert, cooperative and no distress Resp: no distress Cardio: RRR GI: soft, non-tender, gravid, palpable fetal movement  Pelvic: not indicated, not in labor Extremities: no edema, pain, tenderness, or cords    Disposition: Discharge disposition: 01-Home or Self Care       Discharge Instructions    Discharge activity:  Bathroom / Shower only   Complete by: As directed    Discharge activity:  Up to eat   Complete by: As directed    Discharge activity: Bedrest   Complete by: As directed    Discharge diet:  No restrictions   Complete by: As directed    Drink 64 oz water daily.   Discharge instructions   Complete by: As  directed    High risk pregnancy. Bedrest is recommended until [redacted] weeks gestation.   Notify physician for a general feeling that "something is not right"   Complete by: As directed    Notify physician for increase or change in vaginal discharge   Complete by: As directed    Notify physician for intestinal cramps, with or without diarrhea, sometimes described as "gas pain"   Complete by: As directed    Notify physician for leaking of fluid   Complete by: As directed    Notify physician for low, dull backache, unrelieved by heat or Tylenol   Complete by: As directed    Notify physician for menstrual like cramps   Complete by: As directed    Notify physician for pelvic pressure   Complete by: As directed    Notify physician for uterine contractions.  These may be painless and feel like the uterus is tightening or the baby is  "balling up"   Complete by: As directed    Notify physician for vaginal bleeding   Complete by: As directed    PRETERM LABOR:  Includes any of the follwing symptoms that occur between 20 - [redacted] weeks gestation.  If these symptoms are not stopped, preterm labor can result in preterm delivery, placing your baby at risk   Complete by: As directed      Allergies as of 06/09/2019   No Known Allergies     Medication List    STOP taking these medications   metoprolol succinate 25 MG 24 hr tablet Commonly known as: TOPROL-XL     TAKE these medications   aspirin EC 81  MG tablet Take 81 mg by mouth daily.   calcium carbonate 500 MG chewable tablet Commonly known as: TUMS - dosed in mg elemental calcium Chew 2 tablets by mouth as needed for indigestion or heartburn.   cholecalciferol 25 MCG (1000 UNIT) tablet Commonly known as: VITAMIN D3 Take 2,000 Units by mouth daily.   docusate sodium 100 MG capsule Commonly known as: COLACE Take 100 mg by mouth daily.   Linzess 290 MCG Caps capsule Generic drug: linaclotide Take 290 mcg by mouth daily before breakfast.    prenatal multivitamin Tabs tablet Take 1 tablet by mouth daily at 12 noon.   sertraline 25 MG tablet Commonly known as: ZOLOFT Take 25 mg by mouth 2 (two) times daily.      Follow-up [facility]. Go in 1 day(s).   Specialty: Obstetrics and Gynecology Why: Keep scheduled appt for NST on 06/10/19. Contact information: [address] [place] [phone]        MFM recommendation: Due to gestational hypertension, delivery is recommended at around 37 weeks.    Delivery prior to 37 weeks would be indicated should she develop any signs or symptoms of severe preeclampsia, should her blood pressures be persistently be 150/100s or higher, or should her serum creatinine levels rise to 1.2 or higher.  Signed: Arrie Eastern MSN, CNM 06/09/2019, 3:11 PM

## 2019-06-09 NOTE — Consult Note (Signed)
MFM Consult  This patient was seen in consultation due to gestational hypertension. Ms. Bianca Smith is a 39 year old gravida 1 para 0 currently at 35 weeks 0 days.  She was admitted 2 days ago due to concerns regarding her elevated blood pressures.  The patient denies any history of chronic hypertension.  She reports that her blood pressures have been rising over the past month or so.  The patient denies any significant past medical history.  She reports a prior myomectomy.  This is an IVF pregnancy.  On admission, her [place] labs showed a serum creatinine level of 1.1.  The rest of her [place] labs were within normal limits.  Due to concerns regarding preeclampsia, the patient was given a complete course of antenatal corticosteroids.  Her 24-hour urine did not show any significant amount of protein.  Her repeat [place] labs have shown that her serum creatinine has now dropped down to 0.98.  The patient did have serum creatinine levels 3 years ago when she was not pregnant that was in the 1.1 range.  [name] blood pressures over the past two days have been between 127/82 to 155/90.  She denies any signs or symptoms of preeclampsia.  Her fetal status has been reassuring.  The patient had an ultrasound performed in your office two days ago that showed appropriate fetal growth and normal amniotic fluid levels.  As the patient's blood pressures are currently stable, her serum creatinine levels have decreased, and as she is asymptomatic, outpatient management on modified home bedrest may be considered.  She should continue twice weekly fetal testing and blood pressure checks in your office. She should continue to have weekly [place] labs drawn.  Due to gestational hypertension, delivery is recommended at around 37 weeks.    Delivery prior to 37 weeks would be indicated should she develop any signs or symptoms of severe preeclampsia, should her blood pressures be persistently be 150/100s or higher, or should her serum  creatinine levels rise to 1.2 or higher.  At the end of the consultation, the patient stated that all her questions had been answered to her complete satisfaction.    Thank you for referring this very nice patient for a Maternal-Fetal Medicine consultation.

## 2019-06-09 NOTE — Progress Notes (Signed)
Patient was given information on low amniotic fluid.  Bianca Smith 06/09/2019 5:48 PM

## 2019-06-09 NOTE — Progress Notes (Signed)
I appreciate Dr. Fritz Pickerel consultation.  Many lengthy conversations have taken place with the patient who is very anxious and with all of the recent events "just wants the baby out".  With GHTN with oligohydramnios, I think that is reasonable and have discussed risks benefits and alternatives with the patient including but not limited to bleeding, infection, injury and possible NICU admission.  Patient's questions have been answered and consent signed and witnessed for c-section and revision of scar.  Patient said she wanted a c-section as soon as she learned of possibility of induction.  She has a h/o myomectomy and she would rather have a c-section than proceed with induction, labor and possible c-section.

## 2019-06-10 ENCOUNTER — Encounter (HOSPITAL_COMMUNITY)
Admission: AD | Disposition: A | Discharge status: Home / Self Care | Payer: Self-pay | Source: Home / Self Care | Attending: Obstetrics and Gynecology

## 2019-06-10 ENCOUNTER — Other Ambulatory Visit: Payer: Self-pay

## 2019-06-10 ENCOUNTER — Observation Stay (HOSPITAL_COMMUNITY): Payer: No Typology Code available for payment source | Admitting: Anesthesiology

## 2019-06-10 ENCOUNTER — Encounter (HOSPITAL_COMMUNITY): Payer: Self-pay | Admitting: Obstetrics and Gynecology

## 2019-06-10 DIAGNOSIS — Z3490 Encounter for supervision of normal pregnancy, unspecified, unspecified trimester: Secondary | ICD-10-CM

## 2019-06-10 HISTORY — PX: SCAR REVISION: SHX5285

## 2019-06-10 LAB — CBC
HCT: 31.6 % — ABNORMAL LOW (ref 36.0–46.0)
Hemoglobin: 10.5 g/dL — ABNORMAL LOW (ref 12.0–15.0)
MCH: 28 pg (ref 26.0–34.0)
MCHC: 33.2 g/dL (ref 30.0–36.0)
MCV: 84.3 fL (ref 80.0–100.0)
Platelets: 349 10*3/uL (ref 150–400)
RBC: 3.75 MIL/uL — ABNORMAL LOW (ref 3.87–5.11)
RDW: 14.6 % (ref 11.5–15.5)
WBC: 17.9 10*3/uL — ABNORMAL HIGH (ref 4.0–10.5)
nRBC: 0.2 % (ref 0.0–0.2)

## 2019-06-10 LAB — COMPREHENSIVE METABOLIC PANEL
ALT: 15 U/L (ref 0–44)
AST: 18 U/L (ref 15–41)
Albumin: 2.4 g/dL — ABNORMAL LOW (ref 3.5–5.0)
Alkaline Phosphatase: 89 U/L (ref 38–126)
Anion gap: 10 (ref 5–15)
BUN: 9 mg/dL (ref 6–20)
CO2: 21 mmol/L — ABNORMAL LOW (ref 22–32)
Calcium: 8.6 mg/dL — ABNORMAL LOW (ref 8.9–10.3)
Chloride: 110 mmol/L (ref 98–111)
Creatinine, Ser: 0.94 mg/dL (ref 0.44–1.00)
GFR calc Af Amer: >60 mL/min (ref >60)
GFR calc non Af Amer: >60 mL/min (ref >60)
Glucose, Bld: 105 mg/dL — ABNORMAL HIGH (ref 70–99)
Potassium: 4.1 mmol/L (ref 3.5–5.1)
Sodium: 141 mmol/L (ref 135–145)
Total Bilirubin: 0.2 mg/dL — ABNORMAL LOW (ref 0.3–1.2)
Total Protein: 5.4 g/dL — ABNORMAL LOW (ref 6.5–8.1)

## 2019-06-10 LAB — RPR: RPR Ser Ql: NONREACTIVE

## 2019-06-10 SURGERY — Surgical Case
Anesthesia: Spinal

## 2019-06-10 SURGERY — Surgical Case
Anesthesia: Spinal | Wound class: Clean Contaminated

## 2019-06-10 MED ORDER — TRIAMCINOLONE ACETONIDE 40 MG/ML IJ SUSP
INTRAMUSCULAR | Status: AC
  Filled 2019-06-10: qty 1

## 2019-06-10 MED ORDER — KETOROLAC TROMETHAMINE 30 MG/ML IJ SOLN
INTRAMUSCULAR | Status: AC
  Filled 2019-06-10: qty 1

## 2019-06-10 MED ORDER — FENTANYL CITRATE (PF) 100 MCG/2ML IJ SOLN: 50.0000 ug | INTRAMUSCULAR | Status: DC | PRN

## 2019-06-10 MED ORDER — DEXAMETHASONE SODIUM PHOSPHATE 10 MG/ML IJ SOLN
INTRAMUSCULAR | Status: DC | PRN
  Administered 2019-06-10: 10 mg via INTRAVENOUS

## 2019-06-10 MED ORDER — TETANUS-DIPHTH-ACELL PERTUSSIS 5-2.5-18.5 LF-MCG/0.5 IM SUSP: 0.5000 mL | Freq: Once | INTRAMUSCULAR | Status: DC

## 2019-06-10 MED ORDER — TRIAMCINOLONE ACETONIDE 40 MG/ML IJ SUSP
INTRAMUSCULAR | Status: DC | PRN
  Administered 2019-06-10: 40 mg via INTRAMUSCULAR

## 2019-06-10 MED ORDER — LIDOCAINE-EPINEPHRINE (PF) 2 %-1:200000 IJ SOLN
INTRAMUSCULAR | Status: AC
  Filled 2019-06-10: qty 10

## 2019-06-10 MED ORDER — ONDANSETRON HCL 4 MG/2ML IJ SOLN
INTRAMUSCULAR | Status: DC | PRN
  Administered 2019-06-10: 4 mg via INTRAVENOUS

## 2019-06-10 MED ORDER — OXYCODONE HCL 5 MG PO TABS
5.0000 mg | ORAL_TABLET | ORAL | Status: DC | PRN
  Administered 2019-06-10 (×2): 10 mg via ORAL
  Filled 2019-06-10 (×2): qty 2

## 2019-06-10 MED ORDER — WITCH HAZEL-GLYCERIN EX PADS: 1.0000 "application " | MEDICATED_PAD | CUTANEOUS | Status: DC | PRN

## 2019-06-10 MED ORDER — ZOLPIDEM TARTRATE 5 MG PO TABS: 5.0000 mg | ORAL_TABLET | Freq: Every evening | ORAL | Status: DC | PRN

## 2019-06-10 MED ORDER — DIPHENHYDRAMINE HCL 50 MG/ML IJ SOLN: 12.5000 mg | INTRAMUSCULAR | Status: DC | PRN

## 2019-06-10 MED ORDER — LACTATED RINGERS IV SOLN: INTRAVENOUS | Status: DC

## 2019-06-10 MED ORDER — FENTANYL CITRATE (PF) 100 MCG/2ML IJ SOLN: 25.0000 ug | INTRAMUSCULAR | Status: DC | PRN

## 2019-06-10 MED ORDER — LIDOCAINE 2% (20 MG/ML) 5 ML SYRINGE
INTRAMUSCULAR | Status: AC
  Filled 2019-06-10: qty 5

## 2019-06-10 MED ORDER — NALBUPHINE HCL 10 MG/ML IJ SOLN: 5.0000 mg | Freq: Once | INTRAMUSCULAR | Status: DC | PRN

## 2019-06-10 MED ORDER — NALOXONE HCL 4 MG/10ML IJ SOLN
1.0000 ug/kg/h | INTRAVENOUS | Status: DC | PRN
  Filled 2019-06-10: qty 5

## 2019-06-10 MED ORDER — NALBUPHINE HCL 10 MG/ML IJ SOLN: 5.0000 mg | INTRAMUSCULAR | Status: DC | PRN

## 2019-06-10 MED ORDER — KETOROLAC TROMETHAMINE 30 MG/ML IJ SOLN: 30.0000 mg | Freq: Four times a day (QID) | INTRAMUSCULAR | Status: AC | PRN

## 2019-06-10 MED ORDER — DEXTROSE 5 % IV SOLN
INTRAVENOUS | Status: AC
  Filled 2019-06-10: qty 3000

## 2019-06-10 MED ORDER — SODIUM CHLORIDE 0.9 % IR SOLN
Status: DC | PRN
  Administered 2019-06-10 (×2): 1

## 2019-06-10 MED ORDER — MEPERIDINE HCL 25 MG/ML IJ SOLN
INTRAMUSCULAR | Status: AC
  Filled 2019-06-10: qty 1

## 2019-06-10 MED ORDER — DIPHENHYDRAMINE HCL 25 MG PO CAPS
25.0000 mg | ORAL_CAPSULE | Freq: Four times a day (QID) | ORAL | Status: DC | PRN
  Administered 2019-06-10 (×3): 25 mg via ORAL
  Filled 2019-06-10 (×3): qty 1

## 2019-06-10 MED ORDER — PRENATAL MULTIVITAMIN CH
1.0000 | ORAL_TABLET | Freq: Every day | ORAL | Status: DC
  Administered 2019-06-10 – 2019-06-13 (×4): 1 via ORAL
  Filled 2019-06-10 (×3): qty 1

## 2019-06-10 MED ORDER — FENTANYL CITRATE (PF) 100 MCG/2ML IJ SOLN
INTRAMUSCULAR | Status: DC | PRN
  Administered 2019-06-10: 15 ug via INTRATHECAL

## 2019-06-10 MED ORDER — KETOROLAC TROMETHAMINE 30 MG/ML IJ SOLN
30.0000 mg | Freq: Four times a day (QID) | INTRAMUSCULAR | Status: AC | PRN
  Administered 2019-06-10: 30 mg via INTRAMUSCULAR

## 2019-06-10 MED ORDER — SIMETHICONE 80 MG PO CHEW
80.0000 mg | CHEWABLE_TABLET | ORAL | Status: DC
  Administered 2019-06-10 – 2019-06-13 (×3): 80 mg via ORAL
  Filled 2019-06-10 (×3): qty 1

## 2019-06-10 MED ORDER — MEPERIDINE HCL 25 MG/ML IJ SOLN
INTRAMUSCULAR | Status: DC | PRN
  Administered 2019-06-10 (×2): 12.5 mg via INTRAVENOUS

## 2019-06-10 MED ORDER — PHENYLEPHRINE HCL-NACL 20-0.9 MG/250ML-% IV SOLN
INTRAVENOUS | Status: DC | PRN
  Administered 2019-06-10: 60 ug/min via INTRAVENOUS

## 2019-06-10 MED ORDER — OXYTOCIN 40 UNITS IN NORMAL SALINE INFUSION - SIMPLE MED
2.5000 [IU]/h | INTRAVENOUS | Status: AC
  Administered 2019-06-10: 2.5 [IU]/h via INTRAVENOUS

## 2019-06-10 MED ORDER — SIMETHICONE 80 MG PO CHEW: 80.0000 mg | CHEWABLE_TABLET | ORAL | Status: DC | PRN

## 2019-06-10 MED ORDER — COCONUT OIL OIL: 1.0000 "application " | TOPICAL_OIL | Status: DC | PRN

## 2019-06-10 MED ORDER — DIBUCAINE (PERIANAL) 1 % EX OINT: 1.0000 "application " | TOPICAL_OINTMENT | CUTANEOUS | Status: DC | PRN

## 2019-06-10 MED ORDER — SENNOSIDES-DOCUSATE SODIUM 8.6-50 MG PO TABS
2.0000 | ORAL_TABLET | ORAL | Status: DC
  Administered 2019-06-10 – 2019-06-13 (×3): 2 via ORAL
  Filled 2019-06-10 (×3): qty 2

## 2019-06-10 MED ORDER — ONDANSETRON HCL 4 MG/2ML IJ SOLN: 4.0000 mg | Freq: Three times a day (TID) | INTRAMUSCULAR | Status: DC | PRN

## 2019-06-10 MED ORDER — BUPIVACAINE IN DEXTROSE 0.75-8.25 % IT SOLN
INTRATHECAL | Status: DC | PRN
  Administered 2019-06-10: 1.6 mL via INTRATHECAL

## 2019-06-10 MED ORDER — MORPHINE SULFATE (PF) 0.5 MG/ML IJ SOLN
INTRAMUSCULAR | Status: DC | PRN
  Administered 2019-06-10: .15 mg via INTRATHECAL

## 2019-06-10 MED ORDER — MENTHOL 3 MG MT LOZG: 1.0000 | LOZENGE | OROMUCOSAL | Status: DC | PRN

## 2019-06-10 MED ORDER — MEPERIDINE HCL 25 MG/ML IJ SOLN: 6.2500 mg | INTRAMUSCULAR | Status: DC | PRN

## 2019-06-10 MED ORDER — SODIUM CHLORIDE 0.9% FLUSH: 3.0000 mL | INTRAVENOUS | Status: DC | PRN

## 2019-06-10 MED ORDER — NALOXONE HCL 0.4 MG/ML IJ SOLN: 0.4000 mg | INTRAMUSCULAR | Status: DC | PRN

## 2019-06-10 MED ORDER — SIMETHICONE 80 MG PO CHEW
80.0000 mg | CHEWABLE_TABLET | Freq: Three times a day (TID) | ORAL | Status: DC
  Administered 2019-06-10 – 2019-06-13 (×11): 80 mg via ORAL
  Filled 2019-06-10 (×10): qty 1

## 2019-06-10 MED ORDER — DIPHENHYDRAMINE HCL 25 MG PO CAPS: 25.0000 mg | ORAL_CAPSULE | ORAL | Status: DC | PRN

## 2019-06-10 MED ORDER — CEFAZOLIN SODIUM-DEXTROSE 2-4 GM/100ML-% IV SOLN
INTRAVENOUS | Status: AC
  Filled 2019-06-10: qty 100

## 2019-06-10 MED ORDER — OXYTOCIN 40 UNITS IN NORMAL SALINE INFUSION - SIMPLE MED
INTRAVENOUS | Status: DC | PRN
  Administered 2019-06-10: 500 mL via INTRAVENOUS

## 2019-06-10 SURGICAL SUPPLY — 47 items
APL SKNCLS STERI-STRIP NONHPOA (GAUZE/BANDAGES/DRESSINGS) ×2
BENZOIN TINCTURE PRP APPL 2/3 (GAUZE/BANDAGES/DRESSINGS) ×5 IMPLANT
BLADE SURG 10 STRL SS (BLADE) ×2 IMPLANT
CHLORAPREP W/TINT 26ML (MISCELLANEOUS) ×3 IMPLANT
CLAMP CORD UMBIL (MISCELLANEOUS) IMPLANT
CLOSURE WOUND 1/2 X4 (GAUZE/BANDAGES/DRESSINGS) ×3
CLOTH BEACON ORANGE TIMEOUT ST (SAFETY) ×3 IMPLANT
DRAPE C SECTION CLR SCREEN (DRAPES) ×2 IMPLANT
DRSG OPSITE POSTOP 4X10 (GAUZE/BANDAGES/DRESSINGS) ×5 IMPLANT
ELECT REM PT RETURN 9FT ADLT (ELECTROSURGICAL) ×3
ELECTRODE REM PT RTRN 9FT ADLT (ELECTROSURGICAL) ×1 IMPLANT
EXTENDER TRAXI PANNICULUS (MISCELLANEOUS) IMPLANT
EXTRACTOR VACUUM M CUP 4 TUBE (SUCTIONS) IMPLANT
EXTRACTOR VACUUM M CUP 4' TUBE (SUCTIONS)
GLOVE BIO SURGEON STRL SZ7.5 (GLOVE) ×3 IMPLANT
GLOVE BIOGEL PI IND STRL 7.0 (GLOVE) ×1 IMPLANT
GLOVE BIOGEL PI IND STRL 7.5 (GLOVE) ×1 IMPLANT
GLOVE BIOGEL PI INDICATOR 7.0 (GLOVE) ×2
GLOVE BIOGEL PI INDICATOR 7.5 (GLOVE) ×2
GOWN STRL REUS W/TWL LRG LVL3 (GOWN DISPOSABLE) ×6 IMPLANT
KIT ABG SYR 3ML LUER SLIP (SYRINGE) IMPLANT
NDL HYPO 25X5/8 SAFETYGLIDE (NEEDLE) IMPLANT
NEEDLE HYPO 22GX1.5 SAFETY (NEEDLE) ×2 IMPLANT
NEEDLE HYPO 25X5/8 SAFETYGLIDE (NEEDLE) IMPLANT
NS IRRIG 1000ML POUR BTL (IV SOLUTION) ×3 IMPLANT
PACK C SECTION WH (CUSTOM PROCEDURE TRAY) ×3 IMPLANT
PAD OB MATERNITY 4.3X12.25 (PERSONAL CARE ITEMS) ×3 IMPLANT
PENCIL SMOKE EVAC W/HOLSTER (ELECTROSURGICAL) ×3 IMPLANT
RETRACTOR TRAXI PANNICULUS (MISCELLANEOUS) IMPLANT
RTRCTR C-SECT PINK 25CM LRG (MISCELLANEOUS) ×3 IMPLANT
SPONGE LAP 18X18 X RAY DECT (DISPOSABLE) ×2 IMPLANT
STRIP CLOSURE SKIN 1/2X4 (GAUZE/BANDAGES/DRESSINGS) ×4 IMPLANT
SUT CHROMIC 2 0 CT 1 (SUTURE) ×3 IMPLANT
SUT MNCRL AB 3-0 PS2 27 (SUTURE) ×3 IMPLANT
SUT PLAIN 2 0 XLH (SUTURE) ×3 IMPLANT
SUT VIC AB 0 CT1 36 (SUTURE) ×3 IMPLANT
SUT VIC AB 0 CTX 36 (SUTURE) ×9
SUT VIC AB 0 CTX36XBRD ANBCTRL (SUTURE) ×3 IMPLANT
SUT VIC AB 2-0 SH 27 (SUTURE) ×6
SUT VIC AB 2-0 SH 27XBRD (SUTURE) ×2 IMPLANT
SYR CONTROL 10ML LL (SYRINGE) ×2 IMPLANT
TAPE STRIPS DRAPE STRL (GAUZE/BANDAGES/DRESSINGS) ×2 IMPLANT
TOWEL OR 17X24 6PK STRL BLUE (TOWEL DISPOSABLE) ×3 IMPLANT
TRAXI PANNICULUS EXTENDER (MISCELLANEOUS) ×2
TRAXI PANNICULUS RETRACTOR (MISCELLANEOUS) ×2
TRAY FOLEY W/BAG SLVR 14FR LF (SET/KITS/TRAYS/PACK) ×3 IMPLANT
WATER STERILE IRR 1000ML POUR (IV SOLUTION) ×3 IMPLANT

## 2019-06-10 NOTE — Transfer of Care (Signed)
Immediate Anesthesia Transfer of Care Note  Patient: ARTESIA DUNMORE  Procedure(s) Performed: CESAREAN SECTION (N/A ) SCAR REVISION (N/A )  Patient Location: PACU  Anesthesia Type:Spinal  Level of Consciousness: awake, alert  and oriented  Airway & Oxygen Therapy: Patient Spontanous Breathing  Post-op Assessment: Report given to RN and Post -op Vital signs reviewed and stable  Post vital signs: Reviewed and stable  Last Vitals:  Vitals Value Taken Time  BP 141/74 06/10/19 0219  Temp    Pulse 72 06/10/19 0221  Resp 18 06/10/19 0221  SpO2 100 % 06/10/19 0221  Vitals shown include unvalidated device data.  Last Pain:  Vitals:   06/09/19 2338  TempSrc:   PainSc: 0-No pain         Complications: No apparent anesthesia complications

## 2019-06-10 NOTE — Lactation Note (Addendum)
This note was copied from a baby's chart. Lactation Consultation Note Baby 5 hrs old. Very sleepy. RN gave formula via curve tip syring and gloved finger. Encouraged parents to use bottle nipples that 35 weeks babies need to learn to suckle on nipple.  Mom has large breast w/flat semi compressible nipples. Nipples evert w/stimulation.  Hand expressed breast. Rt. Breast a few drops of thick colostrum noted. Lt. Breast no colostrum noted but evidence of dried colostrum on areola and base of nipple.  Mom stated baby has really BF. They have tried but baby will not suckle. LPI information sheet given and reviewed. Encouraged to study that information sheet on LPI behavior and special needs.  Attempted to latch to breast. Baby wouldn't suckle at all. Had no interest in BF. Discussed w/parents that if baby isn't going to the breast baby will need more than the supplemental amount.  Mom shown how to use DEBP & how to disassemble, clean, & reassemble parts. Mom knows to pump q3h for 15-20 min. Mom has pumped one breast, now pumping other breast. 24 flange was used for pumping. Mom is very sleepy and tired. FOB at bedside and supportive. Mom has been doing STS, now mom is going to try to sleep, FOB holding baby STS.  Shells taken to room as well as hand pump. Mom resting.  LC will f/u again later today.  Expressed importance of STS and strict I&O.  Mom has Similac 22 cal at bedside. LC suggest Similac 24 cal. D/t baby less than 5 lbs.  Gave lactation brochure.  Patient Name: Bianca Smith [id number] Date: 06/10/2019 Reason for consult: Initial assessment;Late-preterm 34-36.6wks;Infant < 6lbs;Primapara   Maternal Data Has patient been taught Hand Expression?: Yes Does the patient have breastfeeding experience prior to this delivery?: No  Feeding Feeding Type: Breast Fed  LATCH Score Latch: Too sleepy or reluctant, no latch achieved, no sucking elicited.  Audible Swallowing:  None  Type of Nipple: Flat  Comfort (Breast/Nipple): Soft / non-tender  Hold (Positioning): Full assist, staff holds infant at breast  LATCH Score: 3  Interventions Interventions: Breast feeding basics reviewed;Support pillows;Assisted with latch;Position options;Skin to skin;Breast massage;Hand express;Shells;Pre-pump if needed;Reverse pressure;Hand pump;Breast compression;DEBP;Adjust position  Lactation Tools Discussed/Used Tools: Shells;Pump;Flanges;Nipple Shields Nipple shield size: 20 Flange Size: 21 Shell Type: Inverted Breast pump type: Double-Electric Breast Pump;Manual WIC Program: No Pump Review: Setup, frequency, and cleaning;Milk Storage Initiated by:: RN Date initiated:: 06/10/19   Consult Status Consult Status: Follow-up Date: 06/10/19 Follow-up type: In-patient    Britany Callicott, Elta [place] 06/10/2019, 6:15 AM

## 2019-06-10 NOTE — Progress Notes (Signed)
[name] was referred for history of anxiety. * Referral screened out by Clinical Social Worker because none of the following criteria appear to apply: ~ History of anxiety during this pregnancy, or of post-partum depression following prior delivery. Per OB records review, no concerns of anxiety noted.  ~ Diagnosis of anxiety within last 3 years. Per chart review, [name]'s anxiety dates back to 2017. OR * [name]'s symptoms currently being treated with medication and/or therapy. Please contact the Clinical Social Worker if needs arise, by [facility] request, or if [name] scores greater than 9/yes to question 10 on Edinburgh Postpartum Depression Screen.  Bianca Smith, [place] Worker [facility] Cell#: [phone]

## 2019-06-10 NOTE — Anesthesia Postprocedure Evaluation (Signed)
Anesthesia Post Note  Patient: Bianca Smith  Procedure(s) Performed: CESAREAN SECTION (N/A ) SCAR REVISION (N/A )     Patient location during evaluation: PACU Anesthesia Type: Spinal Level of consciousness: oriented and awake and alert Pain management: pain level controlled Vital Signs Assessment: post-procedure vital signs reviewed and stable Respiratory status: spontaneous breathing, respiratory function stable and nonlabored ventilation Cardiovascular status: blood pressure returned to baseline and stable Postop Assessment: no headache, no backache, no apparent nausea or vomiting, spinal receding and patient able to bend at knees Anesthetic complications: no    Last Vitals:              Sachiko Methot A.

## 2019-06-10 NOTE — Anesthesia Procedure Notes (Signed)
Spinal  Patient location during procedure: OR Start time: 06/10/2019 12:12 AM End time: 06/10/2019 12:16 AM Staffing Performed: anesthesiologist  Anesthesiologist: Josephine Igo, MD Preanesthetic Checklist Completed: patient identified, IV checked, site marked, risks and benefits discussed, surgical consent, monitors and equipment checked, pre-op evaluation and timeout performed Spinal Block Patient position: sitting Prep: DuraPrep and site prepped and draped Patient monitoring: heart rate, cardiac monitor, continuous pulse ox and blood pressure Approach: midline Location: L3-4 Injection technique: single-shot Needle Needle type: Pencan  Needle gauge: 24 G Needle length: 9 cm Needle insertion depth: 7 cm Assessment Sensory level: T4 Additional Notes Patient tolerated procedure well. Adequate sensory level.

## 2019-06-10 NOTE — Lactation Note (Addendum)
This note was copied from a baby's chart. Lactation Consultation Note  Patient Name: Bianca Smith [id number] Date: 06/10/2019 Reason for consult: Follow-up assessment;Primapara;1st time breastfeeding;Late-preterm 34-36.6wks;Infant < 6lbs GHTN,  AMA, embryo transfer, infertility.  Visited with P1 Mom of LPTI weighing 4 lbs 11 oz at birth.  Baby 65 hrs old and has be switched to paced bottle feeding of 24 cal formula due to small size.  Mom had just napped for a few hours.  FOB doing STS with baby and has fed baby twice by paced bottle, baby took 20 ml and 15 ml for 24 cal formula.    Assisted Mom with double pumping for 3rd time.  Reviewed breast massage and hand expression, small drop of colostrum noted.  Hand's free bra made from mesh panties per Mom's request.  Reviewed importance of disassembling pump parts, washing, rinsing and air drying in separate bin provided.  Mom very sleepy currently.   Parents told about pump rental available in gift shop (M-F only) explaining that a hospital grade pump is the best in establishing milk supply.  Mom has a Medela Max Flow DEBP at home also.   Encouraged frequent pumping, >8 times per 24 hrs.  Mom has a lot of questions.  Reassurance given about length of time it may take for baby to be exclusively breastfeeding.  Mom aware of OP lactation support available and how she desires follow-up after discharge.  Interventions Interventions: Breast feeding basics reviewed;Skin to skin;Breast massage;Hand express;DEBP;Hand pump;Support pillows  Lactation Tools Discussed/Used Tools: Pump;Shells Flange Size: 24 Shell Type: Inverted Breast pump type: Double-Electric Breast Pump;Manual   Consult Status Consult Status: Follow-up Date: 06/11/19 Follow-up type: In-patient    Bianca Smith 06/10/2019, 1:28 PM

## 2019-06-10 NOTE — Op Note (Addendum)
Cesarean Section Procedure Note  Indications: 39 yo P0 at 55 1/7wks with gestational hypertension and oligohydramnios with h/o myomectomy transferred from [facility] care for c-section after MFM consultation earlier today.  Patient also wants revision of a prior scar that was hypertrophied.  Pre-operative Diagnosis: 1.Cesarean section 2.Hypertrophied Scar 3.Gestational Hypertension, 4.Oligohydraminos 5.History of Myomectomy    Post-operative Diagnosis:1.Cesarean section 2.Hypertrophied Scar 3.Gestational Hypertension, 4.Oligohydraminos 5.History of Myomectomy   Procedure: 1.Primary LTCS 2.Scar Revision  Surgeon: Everett Graff, MD    Assistants: Burman Foster, CNM  Anesthesia: Regional  Anesthesiologist: Josephine Igo, MD   Procedure Details  The patient was taken to the operating room secondary to gestat after the risks, benefits, complications, treatment options, and expected outcomes were discussed with the patient.  The patient concurred with the proposed plan, giving informed consent which was signed and witnessed. The patient was taken to Operating Room B, identified as Bianca Smith and the procedure verified as C-Section Delivery and Scar Revision. A Time Out was held and the above information confirmed.  After induction of anesthesia by obtaining a spinal, the patient was prepped and draped in the usual sterile manner. A Pfannenstiel skin incision was made and carried down through the subcutaneous tissue to the underlying layer of fascia.  The fascia was incised bilaterally and extended transversely bilaterally with the Mayo scissors. Kocher clamps were placed on the inferior aspect of the fascial incision and the underlying rectus muscle was separated from the fascia. The same was done on the superior aspect of the fascial incision.  The peritoneum was identified, entered bluntly and extended manually.  An Alexis self-retaining retractor was placed.  The utero-vesical peritoneal  reflection was incised transversely and the bladder flap was bluntly freed from the lower uterine segment. A low transverse uterine incision was made with the scalpel and extended bilaterally with the bandage scissors.  The infant was delivered in vertex position without difficulty.  After the umbilical cord was clamped and cut, the infant was handed to the awaiting pediatricians.  Cord blood was obtained for evaluation.  The placenta was removed intact and appeared to be within normal limits. The uterus was cleared of all clots and debris. The uterine incision was closed with running interlocking sutures of 0 Vicryl and a second imbricating layer was performed as well.   Bilateral tubes and ovaries appeared to be within normal limits.  Good hemostasis was noted.  Copious irrigation was performed until clear.  The peritoneum was repaired with 2-0 chromic via a running suture.  The fascia was reapproximated with a running suture of 0 Vicryl. The subcutaneous tissue was reapproximated with 3 interrupted sutures of 2-0 plain.  The skin was reapproximated with a subcuticular suture of 3-0 monocryl.  Steristrips were applied.  The drape drape was removed from over prior scar and prepped per usual fashion.  10cc of 2% lidocaine injected in scar and scar excised and incision repaired with 3-0 monocryl and steristrips applied.  Kenalog 40mg  in 10cc of NS was in total injected in both incisions.  Instrument, sponge, and needle counts were correct prior to abdominal closure and at the conclusion of the case.  The patient was awaiting transfer to the recovery room in good condition.  Findings: Live female infant with Apgars 9 at one minute and 9 at five minutes.  Normal appearing bilateral ovaries and fallopian tubes were noted.  True knot in cord.  Estimated Blood Loss:  386 ml  Drains: foley to gravity 250 cc         Total IV Fluids: 1000 ml         Specimens to Pathology: Placenta         Complications:   None; patient tolerated the procedure well.         Disposition: PACU - hemodynamically stable.         Condition: stable  Attending Attestation: I performed the procedure.

## 2019-06-11 MED ORDER — HYDROCODONE-ACETAMINOPHEN 5-325 MG PO TABS
1.0000 | ORAL_TABLET | Freq: Four times a day (QID) | ORAL | Status: DC | PRN
  Administered 2019-06-11 – 2019-06-13 (×5): 2 via ORAL
  Filled 2019-06-11 (×5): qty 2

## 2019-06-11 NOTE — Lactation Note (Signed)
This note was copied from a baby's chart. Lactation Consultation Note  Patient Name: Bianca Smith [id number] Date: 06/11/2019  Follow up visit made for feeding assist.  Baby is currently in the nursery for circumcision.  Instructed to call when baby returns and cueing.   Maternal Data    Feeding Feeding Type: Breast Fed  LATCH Score                   Interventions    Lactation Tools Discussed/Used     Consult Status      Ave Filter 06/11/2019, 12:17 PM

## 2019-06-11 NOTE — Lactation Note (Signed)
This note was copied from a baby's chart. Lactation Consultation Note  Patient Name: Bianca Smith [id number] Date: 06/11/2019  Baby is 33 hours old/0% weight loss.  Mom voices concern that she will be discharged tomorrow and baby is not latching to breast yet.  She attempts but baby is either sleepy or unable to grasp tissue.  Reviewed again what normal breastfeeding expectations are for 35.[redacted] weeks gestation.  Explained that baby may not breastfeed well for 4-5 weeks.  Mom desires to exclusively breastfeed.  Reassured mom that is still a realistic goal in the future.  Stressed importance to remain consistent with pumping 8-12 times/day.  Mom reports that she is pumping but not obtaining milk. She has 2 DEBPs at home.  Discussed milk coming to volume.  Recommended an outpatient appointment 2 weeks after discharge.  Reviewed available phone numbers.  Baby had formula one hour ago and is sleeping in FOB's arms.  Instructed to call for latch assist when baby showing cues.   Maternal Data    Feeding Feeding Type: Breast Fed  LATCH Score                   Interventions    Lactation Tools Discussed/Used     Consult Status      Ave Filter 06/11/2019, 10:28 AM

## 2019-06-11 NOTE — Progress Notes (Signed)
Patient ID: Bianca Smith, female   DOB: 06-19-1980, 39 y.o.   MRN: [id number] Subjective: POD# 1 Live born female  Birth Weight: 4 lb 11 oz (2125 g) APGAR: 9, 9  Newborn Delivery   Birth date/time: 06/10/2019 00:46:00 Delivery type: C-Section, Low Transverse Trial of labor: No C-section categorization: Primary    Delivering provider: Everett Graff   circumcision today Feeding: breast  Pain control at delivery: Spinal   Reports feeling well.  Patient reports tolerating PO.   Breast symptoms: none Pain controlled with PO meds Denies HA/SOB/C/P/N/V/dizziness. Flatus absent. She reports vaginal bleeding as normal, without clots.  She is ambulating, urinating without difficulty.     Objective:   VS:    Vitals:   06/10/19 1629 06/10/19 1912 06/11/19 0451 06/11/19 0822  BP: 123/71 129/88 137/84 (!) 142/87  Pulse: 69 82 82 74  Resp: 18 18 18    Temp: 97.8 F (36.6 C) 98 F (36.7 C) 98.4 F (36.9 C) 97.7 F (36.5 C)  TempSrc: Oral Oral Oral Oral  SpO2: 98% 99% 100% 100%  Weight:      Height:          Intake/Output Summary (Last 24 hours) at 06/11/2019 1000 Last data filed at 06/10/2019 2230 Gross per 24 hour  Intake --  Output 1400 ml  Net -1400 ml        Recent Labs    06/09/19 2136 06/10/19 0507  WBC 12.2* 17.9*  HGB 11.1* 10.5*  HCT 34.0* 31.6*  PLT 380 349   CMP Latest Ref Rng & Units 06/10/2019 06/09/2019 06/08/2019  Glucose 70 - 99 mg/dL 105(H) 125(H) 147(H)  BUN 6 - 20 mg/dL 9 9 7   Creatinine 0.44 - 1.00 mg/dL 0.94 0.98 0.99  Sodium 135 - 145 mmol/L 141 137 136  Potassium 3.5 - 5.1 mmol/L 4.1 4.4 4.1  Chloride 98 - 111 mmol/L 110 108 110  CO2 22 - 32 mmol/L 21(L) 19(L) 18(L)  Calcium 8.9 - 10.3 mg/dL 8.6(L) 8.8(L) 9.3  Total Protein 6.5 - 8.1 g/dL 5.4(L) 5.5(L) 5.6(L)  Total Bilirubin 0.3 - 1.2 mg/dL 0.2(L) 0.3 0.2(L)  Alkaline Phos 38 - 126 U/L 89 98 105  AST 15 - 41 U/L 18 18 18   ALT 0 - 44 U/L 15 13 13      Blood type: --/--/A POS, A POS Performed  at [facility] [address], [place], [place] [address]  [phone])  Rubella:   immune Vaccines: TDaP UTD         Flu    UTD   Physical Exam:  General: alert, cooperative and no distress CV: Regular rate and rhythm Resp: clear Abdomen: soft, nontender, normal bowel sounds Incision: clean, dry and intact Uterine Fundus: firm, below umbilicus, nontender Lochia: minimal Ext: no edema, redness or tenderness in the calves or thighs      Assessment/Plan: 39 y.o.   POD# 1. [id number]                  Principal Problem:   Postpartum care following cesarean delivery 2/4 Active Problems:   Fibroid, uterine   Gestational hypertension w/o significant proteinuria in 3rd trimester  - BP and labs stable, not on meds, good diuresis  - continue to monitor.    Thrombocytosis ([place])  - pts stable inpatient  - followed by heme/onc   Irritable bowel syndrome (IBS)  - stable on Linzess, continue as established   Alpha thalassemia trait   AMA (advanced maternal  age) primigravida 41+   Doing well, stable.               Advance diet as tolerated Encourage rest when baby rests Breastfeeding support Encourage to ambulate, warm fluids to increase gut motility, abdominal binder for comfort Routine post-op care  Juliene Pina, CNM, MSN 06/11/2019, 10:00 AM

## 2019-06-11 NOTE — Lactation Note (Signed)
This note was copied from a baby's chart. Lactation Consultation Note Baby 46 hrs old. Attempted to see mom. Everyone in rm. Sleeping. Spoke to RN to encouraged mom to increase feedings. Encouraged pumping. Lactation w/f/u.  Patient Name: [name] [id number] Date: 06/11/2019     Maternal Data    Feeding    LATCH Score                   Interventions    Lactation Tools Discussed/Used     Consult Status      Theodoro Kalata 06/11/2019, 3:36 AM

## 2019-06-12 ENCOUNTER — Encounter: Payer: Self-pay | Admitting: *Deleted

## 2019-06-12 NOTE — Lactation Note (Signed)
This note was copied from a baby's chart. Lactation Consultation Note  Patient Name: Bianca Smith [id number] Date: 06/12/2019 Reason for consult: Follow-up assessment;Primapara;1st time breastfeeding;Late-preterm 34-36.6wks;Infant < 6lbs;Infant weight loss  Visited with mom of a 61 hours old LPI female < 5 lbs, mom and baby are going home today, baby is at 1% weight loss. Mom is still concerned about not having any drops of colostrum, she told LC she's been pumping but that nothing came out, she understands though that pumping this early is mainly for breast stimulation and she was really hoping to "see" something by now.  Assisted mom with hand expression, LC showed mom how to do lots of breast massage and after a few minutes of massaging and compressing mom got a big drop of colostrum. Dad present and very supportive, mom was so happy that her milk is finally coming in, dad reported (+) breast changes during the pregnancy.  Reviewed discharge instructions, engorgement prevention and treatment, treatment/prevention for sore nipples and cluster feeding. Parents are feeding baby every 3 hours or sooner if feeding cues are present. Mom aware that she needs to continue pumping consistently at least 8 times/24 hours to get her milk supply in. [facility] put a basket request for [facility] OP consultation, mom would like to come back in 2 weeks, and she's aware that baby may not be BF for the next 4-6 weeks but her goal is to exclusively BF eventually.  LC provided reassurance and let her know that is totally doable and that OP LC will follow up with her to get baby "Haze Boyden" on a great start. Mom has a Medela DEBP at home, baby was switched to 22 calorie formula this morning prior discharge. Parents reported all questions and concerns were answered, they're both aware of [facility] OP services and will call PRN.   Maternal Data    Feeding    LATCH Score                   Interventions Interventions: Breast  feeding basics reviewed;Breast massage;Hand express;Breast compression  Lactation Tools Discussed/Used     Consult Status Consult Status: Follow-up(basket request for LC OP appt in two weeks per mom's request) Follow-up type: [place] 06/12/2019, 9:38 AM

## 2019-06-12 NOTE — Progress Notes (Signed)
Bianca Smith [id number] Postpartum Postoperative Day # 2  Bianca Smith, G1P0101, [redacted]w[redacted]d, S/P primary LT Cesarean Section due to h/o myomectomy, AMA, GHTN, no meds, labs WNL, and thrombocytosis.   Subjective: Patient up ad lib, denies syncope or dizziness. Reports consuming regular diet without issues and denies N/V. Patient reports 0 bowel movement + passing flatus.  Denies issues with urination and reports bleeding is "lighter."  Patient is breast and bottle feeding and reports going ok, pt still working with lactation and getting some colostum.  Desires nothing for postpartum contraception.  Pain is being appropriately managed with use of po meds.   Objective: Patient Vitals for the past 24 hrs:  BP Temp Temp src Pulse Resp SpO2  06/12/19 0852 (!) 149/84 98.2 F (36.8 C) Oral 71 18 100 %  06/12/19 0637 133/75 -- -- 91 18 99 %  06/12/19 0422 (!) 153/85 98 F (36.7 C) Oral 74 18 99 %  06/11/19 2310 (!) 117/99 98.5 F (36.9 C) Oral 87 18 98 %  06/11/19 2055 137/78 98.3 F (36.8 C) Oral 83 18 --  06/11/19 1804 (!) 141/89 98.3 F (36.8 C) Oral 89 18 100 %  06/11/19 1312 (!) 138/92 98.6 F (37 C) Oral 92 18 98 %     Physical Exam:  General: alert, cooperative, appears stated age and no distress Mood/Affect: Happy Lungs: clear to auscultation, no wheezes, rales or rhonchi, symmetric air entry.  Heart: normal rate, regular rhythm, normal S1, S2, no murmurs, rubs, clicks or gallops. Breast: breasts appear normal, no suspicious masses, no skin or nipple changes or axillary nodes. Abdomen:  + bowel sounds, soft, non-tender Incision: healing well, no significant drainage, no dehiscence, no significant erythema, Honeycomb dressing  Uterine Fundus: firm, involution -1 Lochia: appropriate Skin: Warm, Dry. DVT Evaluation: No evidence of DVT seen on physical exam. Negative Homan's sign. No cords or calf tenderness. No significant calf/ankle edema.  Labs: Recent Labs    06/09/19 2136  06/10/19 0507  HGB 11.1* 10.5*  HCT 34.0* 31.6*  WBC 12.2* 17.9*    CBG (last 3)  No results for input(s): GLUCAP in the last 72 hours.   I/O: I/O last 3 completed shifts: In: 480 [P.O.:480] Out: 1200 [Urine:1200]   Assessment Postpartum Postoperative Day # 2 Bianca Smith, G1P0101, [redacted]w[redacted]d, S/P primary LT Cesarean Section due to h/o myomectomy, AMA, GHTN, no meds, labs WNL, and thrombocytosis.   Pt stable. -1 Involution. breastFeeding. Hemodynamically Stable with hgb of 11.1-10.5 Thrombocytosis: .Platelet stable at 349  GHTN: labs unremarkable, BP 133/75, asymptomatic. Adequate diuresis.  Baby Female: Circ completed.  IBS: stable, no complaints, no abdominal pain.  Plan: Continue other mgmt as ordered GHNT: Monitor BP and s/sx, f/u in one week after discharge Thrombocytosis: Ambulation, SCD on in  Place. F/U with hem/onc IBS: Continue linzess VTE Prophylactics: SCD, ambulated as tolerates.  Pain control: Motrin/Tylenol/Narcotics PRN Education given regarding options for contraception, including barrier methods, injectable contraception, IUD placement, oral contraceptives.  Plan for discharge tomorrow, Breastfeeding and Lactation consult  Dr. Mancel Bale to be updated on patient status  [facility] NP-C, CNM 06/12/2019, 10:50 AM

## 2019-06-13 LAB — CBC
HCT: 32 % — ABNORMAL LOW (ref 36.0–46.0)
Hemoglobin: 10.4 g/dL — ABNORMAL LOW (ref 12.0–15.0)
MCH: 28 pg (ref 26.0–34.0)
MCHC: 32.5 g/dL (ref 30.0–36.0)
MCV: 86 fL (ref 80.0–100.0)
Platelets: 364 10*3/uL (ref 150–400)
RBC: 3.72 MIL/uL — ABNORMAL LOW (ref 3.87–5.11)
RDW: 14.9 % (ref 11.5–15.5)
WBC: 14.6 10*3/uL — ABNORMAL HIGH (ref 4.0–10.5)
nRBC: 1.5 % — ABNORMAL HIGH (ref 0.0–0.2)

## 2019-06-13 LAB — COMPREHENSIVE METABOLIC PANEL
ALT: 18 U/L (ref 0–44)
AST: 20 U/L (ref 15–41)
Albumin: 2.5 g/dL — ABNORMAL LOW (ref 3.5–5.0)
Alkaline Phosphatase: 76 U/L (ref 38–126)
Anion gap: 8 (ref 5–15)
BUN: 10 mg/dL (ref 6–20)
CO2: 26 mmol/L (ref 22–32)
Calcium: 8.5 mg/dL — ABNORMAL LOW (ref 8.9–10.3)
Chloride: 105 mmol/L (ref 98–111)
Creatinine, Ser: 0.99 mg/dL (ref 0.44–1.00)
GFR calc Af Amer: >60 mL/min (ref >60)
GFR calc non Af Amer: >60 mL/min (ref >60)
Glucose, Bld: 92 mg/dL (ref 70–99)
Potassium: 3.9 mmol/L (ref 3.5–5.1)
Sodium: 139 mmol/L (ref 135–145)
Total Bilirubin: 0.3 mg/dL (ref 0.3–1.2)
Total Protein: 5.3 g/dL — ABNORMAL LOW (ref 6.5–8.1)

## 2019-06-13 LAB — PROTEIN / CREATININE RATIO, URINE
Creatinine, Urine: 99.1 mg/dL
Protein Creatinine Ratio: 0.08 mg/mg{Cre} (ref 0.00–0.15)
Total Protein, Urine: 8 mg/dL

## 2019-06-13 MED ORDER — NIFEDIPINE ER OSMOTIC RELEASE 30 MG PO TB24
30.0000 mg | ORAL_TABLET | Freq: Every day | ORAL | Status: DC
  Administered 2019-06-13: 11:00:00 30 mg via ORAL
  Filled 2019-06-13: qty 1

## 2019-06-13 MED ORDER — HYDROCHLOROTHIAZIDE 12.5 MG PO TABS: 25.0000 mg | ORAL_TABLET | Freq: Every day | ORAL | 0 refills | Status: DC

## 2019-06-13 MED ORDER — NIFEDIPINE ER 30 MG PO TB24: 30.0000 mg | ORAL_TABLET | Freq: Every day | ORAL | 0 refills | Status: DC

## 2019-06-13 MED ORDER — MAGNESIUM OXIDE 400 (241.3 MG) MG PO TABS
400.0000 mg | ORAL_TABLET | Freq: Every day | ORAL | Status: DC
  Administered 2019-06-13: 11:00:00 400 mg via ORAL
  Filled 2019-06-13: qty 1

## 2019-06-13 MED ORDER — HYDROCODONE-ACETAMINOPHEN 5-325 MG PO TABS: 1.0000 | ORAL_TABLET | Freq: Four times a day (QID) | ORAL | 0 refills | Status: DC | PRN

## 2019-06-13 MED ORDER — MAGNESIUM OXIDE 400 (241.3 MG) MG PO TABS: 400.0000 mg | ORAL_TABLET | Freq: Every day | ORAL | 0 refills | Status: DC

## 2019-06-13 NOTE — Discharge Summary (Addendum)
Primary CS OB Discharge Summary     Patient Name: Bianca Smith DOB: 09/20/1980 MRN: [id number]  Date of admission: 06/07/2019 Delivering MD: Everett Graff  Date of delivery: 06/10/2019 Type of delivery: Primary LTCS  Newborn Data: Sex: Baby female Circumcision: performed in pt already Live born female  Birth Weight: 4 lb 11 oz (2125 g) APGAR: 78, 55  Newborn Delivery   Birth date/time: 06/10/2019 00:46:00 Delivery type: C-Section, Low Transverse Trial of labor: No C-section categorization: Primary      Feeding: breast and bottle Infant being discharge to home with mother in stable condition.   Admitting diagnosis: Gestational hypertension w/o significant proteinuria in 3rd trimester [O13.3] Pregnancy [Z34.90] Intrauterine pregnancy: [redacted]w[redacted]d     Secondary diagnosis:  Principal Problem:   Postpartum care following cesarean delivery 2/4 Active Problems:   Fibroid, uterine   Gestational hypertension w/o significant proteinuria in 3rd trimester   Thrombocytosis (HCC)   Irritable bowel syndrome (IBS)   Alpha thalassemia trait   AMA (advanced maternal age) primigravida 35+, third trimester  Oligohydramnios                             Complications: None                                                              Intrapartum Procedures: cesarean: low cervical, transverse Postpartum Procedures: none Complications-Operative and Postpartum: none Augmentation:  AROM at time of delivery    History of Present Illness: Ms. Bianca Smith is a 39 y.o. female, G1P0101, who presents at [redacted]w[redacted]d weeks gestation. The patient has been followed at  [facility] and Gynecology  Her pregnancy has been complicated by:  Patient Active Problem List   Diagnosis Date Noted   Postpartum care following cesarean delivery 2/4 06/10/2019   Gestational hypertension w/o significant proteinuria in 3rd trimester 06/07/2019   Thrombocytosis ([place]) 06/07/2019   HSV infection 06/07/2019    History of loop electrosurgical excision procedure (LEEP) 06/07/2019   Irritable bowel syndrome (IBS) 06/07/2019   Menometrorrhagia 06/07/2019   Alpha thalassemia trait 06/07/2019   AMA (advanced maternal age) primigravida 35+, third trimester 06/07/2019   Vitamin D deficiency 06/07/2019   Drug reaction 04/08/2017   Migraine without aura and with status migrainosus, not intractable 07/08/2016   Morbid obesity ([place]) 04/08/2016   Sleep related headaches 04/08/2016   Snoring 04/08/2016   OSA (obstructive sleep apnea) 04/08/2016   Intractable chronic migraine without aura and with status migrainosus 04/08/2016   Persistent headaches 04/08/2016   Fibroid, uterine 10/11/2015        Hospital course:  Unscheduled C/S   39 y.o. yo G1P0101 at [redacted]w[redacted]d was admitted to the hospital 06/07/2019 at 35 weeks 1 day EGA with elevated Bps and found to have gestational hypertension.  She remained stable while admitted and on day of planned discharge an ultrasound was done that showed oligohydramnios.  MFM consulted then and they recommended delivery at that time.  Patient desired a cesarean delivery and  revision of a prior scar that was hypertrophied..  Membrane Rupture Time/Date: 12:44 AM ,06/10/2019   Patient delivered a Viable infant.06/10/2019  Details of operation can be found in separate operative note.  Pateint had an uncomplicated  postpartum course.  She is ambulating, tolerating a regular diet, passing flatus, and urinating well. Patient is discharged home in stable condition on  06/13/19        Hospital Course--Unscheduled Cesarean: Patient was admitted on 06/10/2019 for a unscheduled primary cesarean delivery.   She was taken to the operating room, where Dr. Mancel Bale performed a Primary LTCS under epidural anesthesia, with delivery of a viable baby female with circ performed in-pt, with weight and Apgars as listed below. Infant was in good condition and remained at the patient's bedside.  The patient was taken to  recovery in good condition.  Patient planned to breast and bottle feed.  On post-op day 1, patient was doing well, tolerating a regular diet, with Hgb of 11.1-1.5.  Throughout her stay, her physical exam was WNL, her incision was CDI, and her vital signs remained stable.  By post-op day 1, she was up ad lib, tolerating a regular diet, with good pain control with po med.  She was deemed to have received the full benefit of her hospital stay, and was discharged home in stable condition.  Contraceptive choice was declined. Pt Bps had been labile since admission, had not been on meds, and labs were negative with unremarkable 24 hour urine, pt endorse shaving some anxiety, and becoming teary eyed when talking about BP this morning, RN report sever range, pt endorses feeling upset, also stated she had a dinner with a large salt intake, pt mostly concerned about 1+ pitting edema in legs, which pt endorses feeling it was looking worse to her, Discuss preeclampsia and diagnosis, endorses I suspected BP was elevated more then usual due to salt intake increased and emotional state, pt calmed down, and BP was retaken with result of 145/88, pt denies HA, RUQ pain or vision changes. Consulted with Dr Alesia Richards, plan to start procardia 30mg  XL today, with PO magnesium 400mg  daily for diuresis, will add 25mg  HCTZ for 1 weeks to pts regimen for diureses to prevent readmission, which pt is adequate on today, cmp and cbc resulted unremarkable this morning, PCR was 0.08, pt will be monitor for a little bit and if BP<150/90s, may be d/c'ed home with close f/u. With h/o anxiety, pt was started on zoloft, but pt refused and declined to take to stated she took this a long time ago and declines needing it now, mood is upset due to worry and strees but pt endorses feeling fine, denies SI/HI. In regards to IBS pt to continue home meds of Linzess.   Physical exam  Vitals:   06/13/19 0900 06/13/19 0910 06/13/19 1210 06/13/19 1620  BP: (!)  160/94 (!) 145/88 (!) 148/85 (!) 146/77  Pulse: 76 75 81 83  Resp:   16   Temp:   98.2 F (36.8 C)   TempSrc:   Oral   SpO2:   100% 98%  Weight:      Height:       General: alert, cooperative and no distress Lochia: appropriate Uterine Fundus: firm Incision: Healing well with no significant drainage, No significant erythema, Dressing is clean, dry, and intact, honeycomb dressing CDI Perineum: Intact DVT Evaluation: No evidence of DVT seen on physical exam. Negative Homan's sign. No cords or calf tenderness. 1+pitting edema No clonus, 2+ DRT patellar.   Labs: Lab Results  Component Value Date   WBC 14.6 (H) 06/13/2019   HGB 10.4 (L) 06/13/2019   HCT 32.0 (L) 06/13/2019   MCV 86.0 06/13/2019   PLT 364 06/13/2019  CMP Latest Ref Rng & Units 06/13/2019  Glucose 70 - 99 mg/dL 92  BUN 6 - 20 mg/dL 10  Creatinine 0.44 - 1.00 mg/dL 0.99  Sodium 135 - 145 mmol/L 139  Potassium 3.5 - 5.1 mmol/L 3.9  Chloride 98 - 111 mmol/L 105  CO2 22 - 32 mmol/L 26  Calcium 8.9 - 10.3 mg/dL 8.5(L)  Total Protein 6.5 - 8.1 g/dL 5.3(L)  Total Bilirubin 0.3 - 1.2 mg/dL 0.3  Alkaline Phos 38 - 126 U/L 76  AST 15 - 41 U/L 20  ALT 0 - 44 U/L 18    Date of discharge: 06/13/2019 Discharge Diagnoses: Preterm delivery via PCS due to [facility] Discharge instruction: per After Visit Summary and "Baby and Me Booklet".  After visit meds:   Activity:           unrestricted and pelvic rest Advance as tolerated. Pelvic rest for 6 weeks.  Diet:                routine Medications: PNV, Colace, Vicodin and procardia 30xl, daily, magnesium home suplements.  Postpartum contraception:  Declined Condition:  Pt discharge to home with baby in stable GHTN: BP check in 1 week, mood check as well, report s/sx of HA, RUQ pain or vision changes.  Anxiety: Mood check in one week, think about starting Zoloft daily.   Meds: Allergies as of 06/13/2019       Reactions   Oxycodone Itching        Medication List      STOP taking these medications    aspirin EC 81 MG tablet   metoprolol succinate 25 MG 24 hr tablet Commonly known as: TOPROL-XL   sertraline 25 MG tablet Commonly known as: ZOLOFT       TAKE these medications    calcium carbonate 500 MG chewable tablet Commonly known as: TUMS - dosed in mg elemental calcium Chew 2 tablets by mouth as needed for indigestion or heartburn.   cholecalciferol 25 MCG (1000 UNIT) tablet Commonly known as: VITAMIN D3 Take 2,000 Units by mouth daily.   docusate sodium 100 MG capsule Commonly known as: COLACE Take 100 mg by mouth daily.   hydrochlorothiazide 12.5 MG tablet Commonly known as: HYDRODIURIL Take 2 tablets (25 mg total) by mouth daily.   HYDROcodone-acetaminophen 5-325 MG tablet Commonly known as: NORCO/VICODIN Take 1 tablet by mouth every 6 (six) hours as needed for moderate pain.   Linzess 290 MCG Caps capsule Generic drug: linaclotide Take 290 mcg by mouth daily before breakfast.   magnesium oxide 400 (241.3 Mg) MG tablet Commonly known as: MAG-OX Take 1 tablet (400 mg total) by mouth daily. Start taking on: June 14, 2019   NIFEdipine 30 MG 24 hr tablet Commonly known as: ADALAT CC Take 1 tablet (30 mg total) by mouth daily. Start taking on: June 14, 2019   prenatal multivitamin Tabs tablet Take 1 tablet by mouth daily at 12 noon.               Discharge Care Instructions  (From admission, onward)           Start     Ordered   06/13/19 0000  Discharge wound care:    Comments: Take dressing off on day 5-7 postpartum.  Report increased drainage, redness or warmth. Clean with water, let soap trickle down body. Can leave steri strips on until they fall off or take them off gently at day 10. Keep open to air, clean and  dry.   06/13/19 1706            Discharge Follow Up:  Follow-up [facility]. Schedule an appointment as soon as possible for a visit  in 1 week(s).   Specialty: Obstetrics and Gynecology Why: F/U in 1 week for BP and mood check, also 6 weeks PPV>  Contact information: [address] [place] [phone] [place], NP-C, [place] 06/13/2019, 5:08 PM  Noralyn Pick, [place]

## 2019-06-13 NOTE — Lactation Note (Signed)
This note was copied from a baby's chart. Lactation Consultation Note  Patient Name: Bianca Smith [id number] Date: 06/13/2019  [facility] in to visit with P1 Mom of [place] at 83 hrs old.  Baby is at 2% weight loss and output is great.  Mom is focused on feeding baby by bottle with occasional latches to the breast.  Baby tires easily and Mom knows to limit breastfeeding to 30 mins max so avoid overtiring baby.    Mom just pumped this am and was able to collect 30 ml.  Breasts felt fuller this am.  Encouraged Mom to double pump 8-12 times per 24 hrs.  Mom asked questions about lactation cookies or other supplements.  Advised Mom to focus on regular double pumping, STS with baby, and breast massage and hand expression.   Recommended she offer breast often, even if baby opens his mouth and falls asleep.     If milk supply increases to more than baby requires, talked about fore and hind milk.  Mom can pump for 5 mins and freeze her fore milk for when baby is 7-8 lbs, and focus on feeding baby her hindmilk which is higher in fat.  Engorgement prevention and treatment discussed.  Mom desires OP lactation follow-up.  Request sent to clinic for OP follow-up in a week.   Mom knows she can call prn for concerns.    Broadus John 06/13/2019, 10:54 AM

## 2019-06-13 NOTE — Progress Notes (Signed)
Pt discharged to home with husband and newborn.  Condition stable.  No equipment for home ordered at discharge.  Pt ambulated to car with D. Gainey-Llyod, NT.

## 2019-06-14 LAB — SURGICAL PATHOLOGY

## 2019-06-22 ENCOUNTER — Ambulatory Visit: Payer: Self-pay

## 2019-06-22 NOTE — Lactation Note (Signed)
This note was copied from a baby's chart. Lactation Consultation Note  Patient Name: Bianca Smith [id number] Date: 06/22/2019     06/22/2019  Name: Bianca Smith MRN: [id number] Date of Birth: 06/10/2021 Gestational Age: Gestational Age: [redacted]w[redacted]d Birth Weight: 75 oz Weight today:  Weight: (!) 4 lb 15.7 oz (2260 g)    102 day old LPT infant presents today with mom for feeding assessment.   Infant has gained 20 grams in the last 9 days with an average daily weight gain of 20 grams a day. Infant lowest weight was 4 pounds 8.5 ounces.   Infant is BF with each feeding and then supplementing with formula fortified EBM via bottle. Mom is pumping about 8 x a day and is getting up to 12 ounces a day.   Mom reports she is exhausted and is having difficulty due to sleepiness. Discussed doing some pumping and bottle feeding instead of offering the breast with each feeding.   Infant is being supplemented with each feeding due to slow weight gain and infant size. He is gaining better.   Infant with thick labial frenulum that inserts at the bottom of the gum ridge. Upper lip tight with flanging. Infant with divot to center of upper lip. Infant with short posterior lingual frenulum. Infant extends tongue to gumline with suckling. Infant with good tongue lateralization. Infant with some decreased mid tongue elevation. Infant latches well to the breast, mom is in a lot of pain with initial latch. Discussed with mom that infant has several factors that may explain his feeding behavior ( 35 weeks, < 5 pounds, and tongue and lip restrictions). Reviewed with mom that it is reasonable to wait until infant is about 40-42 weeks and > 6 pounds to reassess tongue. Discussed how tongue and lip restrictions can effect milk supply and milk transfer. Website and local provider information given.   Reviewed with mom that her milk supply should self regulate in time and that we do not want to decrease her  supply right now and to pump regularly to maintain supply until infant is older.   Mom is feeling engorged. Her milk supply surged at 10 days. Reviewed engorgement care and using Ibuprofen for discomfort.   Infant to follow up with Dr. Sharlene Motts depending on his weight. Infant to follow up with Lactation   General Information: Mother's reason for visit: Feeding assessment, LPT infant, < 6 pounds Consult: Initial Lactation consultant: Nonah Mattes RN,IBCLC Breastfeeding experience: BF with each feeding, offering bottle after BF, pumping to protect milk supply Maternal medical conditions: Pregnancy induced hypertension Maternal medications: Pre-natal vitamin(Vitamin D, Baby Aspirin,  Probiotic, Mg Oxinde, Nifedipine, Lynsess)  Breastfeeding History: Frequency of breast feeding: every 2-3 hours Duration of feeding: 10-15 minutes  Supplementation: Supplement method: bottle(Medela Calma) Brand: Similac(Neosure 22 calorie powder to breast milk) Formula volume: 1/4 tsp to 1 ounce EBM     Breast milk volume: 1-1.5 ounces post breast feeding Breast milk frequency: every 2 hours, self awakens at night, every 3 hours, mom awakens during the day   Pump type: Medela pump in style Pump frequency: every 3 hours Pump volume: 6-12 ounces  Infant Output Assessment: Voids per 24 hours: 6-8 Urine color: Clear yellow Stools per 24 hours: 6-8 Stool color: Yellow  Breast Assessment: Breast: Soft, Compressible, Hyperplasia Nipple: Erect   Pain interventions: Bra, Expressed breast milk, Breast pump  Feeding Assessment: Infant oral assessment: Variance Infant oral assessment comment: see note Positioning: Football(left breast, 5 minutes) Latch: 1 -  Repeated attempts needed to sustain latch, nipple held in mouth throughout feeding, stimulation needed to elicit sucking reflex. Audible swallowing: 1 - A few with stimulation Type of nipple: 2 - Everted at rest and after stimulation Comfort: 1 -  Filling, red/small blisters or bruises, mild/mod discomfort Hold: 2 - No assistance needed to correctly position infant at breast LATCH score: 7 Latch assessment: Deep Lips flanged: Yes Suck assessment: Displays both Tools: Pump, Bottle Pre-feed weight: 2260 grams Post feed weight: 2272 grams Amount transferred: 12 ml Amount supplemented: 15 ml, ate 30 ml prior to visit  Additional Feeding Assessment:                                    Totals: Total amount transferred: 12 ml Total supplement given: 15 ml formula via bottle Total amount pumped post feed: did not pump   Plan:  1. Offer infant the breast with feeding cues for at least 4 times a day Make sure infant gets at least 8 feedings in 24 hours 2. Keep infant awake at the breast as needed to maintain active suckling 3. Feed infant skin to skin 4. Massage/compress breast with feeding to keep infant active at the breast 5. Offer both breasts with each feeding 6. Empty the first breast before offering the second breast 7. Pump off 2 ounces from the breast before latching infant to the breast to help with latch and getting infant the hind milk 8. Offer infant a bottle of pumped breast milk after breastfeeding if he is still cueing to feed, can use formula if no breast milk is available 9. Feed infant using the paced bottle feeding method (video on kellymom.com) 10. Infant needs about 41-55 ml (1.5-2 ounces) for 8 feedings a day or 330-440 ml (11-15 ounces) in 24 hours. Infant may take more or less depending on how often he feeds. Feed infant until he is satisfied.  11. Would recommend that you pump 8-12 x a day for 15-20 minutes with your double electric breast pump to protect and maintain your milk supply.  12. Keep up the good work 26. Thank you for allowing me to assist you today 14. Please call with any questions or concerns as needed [phone] 15. Follow up with Lactation in 1 week   [place],  IBCLC                                                     Donn Pierini 06/22/2019, 1:13 PM

## 2019-06-29 ENCOUNTER — Ambulatory Visit: Payer: Self-pay

## 2019-06-29 NOTE — Lactation Note (Signed)
This note was copied from a baby's chart. Lactation Consultation Note  Patient Name: Bianca Smith [id number] Date: 06/29/2019     06/29/2019  Name: Bianca Smith MRN: [id number] Date of Birth: 06/10/2021 Gestational Age: Gestational Age: [redacted]w[redacted]d Birth Weight: 75 oz Weight today:  Weight: 5 lb 12.6 oz (5168 g)   74 week old LPT infant presents today with mom for feeding assessment. Infant is now 37 weeks 6 days adjusted age.   Infant has gained 364 grams in the last 7 days with an average daily weight gain of 52 grams a day.    Mom feels infant latch has improved. She reports infant is feeding well and is having some cluster feeds. Infant is sometimes sleeping at the breast. He can feed up to 10-20 minutes per feeding. Infant is waking himself up for feeds about every 1-2 hours around the clock.   Infant is going to breast about 3 times and day and otherwise pumping and bottle feeding. Mom is still adding formula powder to her EBM.    Mom reports she is pumping about 4-5 times a day and is getting plenty of milk to feed infant. She is starting to freeze milk also. Reviewed supply and demand and to continue to pump to protect milk supply ober time as it will naturally start decreasing.   Infant latched to the breast and fed well in the office today. Mom reports she that infant is latching better and she is not having pain with feeding after the initial latch. Mom stopped using the NS as infant is able to latch better.   Infant with thick labial frenulum that inserts at the middle of the gum ridge. Upper lip looser with flanging. Infant with divot to center of upper lip. Infant with short posterior lingual frenulum. Infant extends tongue to gumline with suckling. Infant with good tongue lateralization. Infant with some decreased mid tongue elevation. Infant latches well to the breast, pain has decreased to mom's nipples. Nipple rounded post feeding, infant latching much  better to the breast with good transfer.  Discussed with mom that infant has several factors that may explain his feeding behavior ( 35 weeks, < 5 pounds, and tongue and lip restrictions). Reviewed with mom that it is reasonable to wait until infant is about 40-42 weeks and > 6 pounds to reassess tongue.   Infant to follow up with Dr. Sharlene Motts next week. Infant to follow up with Lactation as needed at [facility] request.   Called weight into Dr. Wandra Scot office as requested, left on nurse triage line.      General Information: Mother's reason for visit: Follow up feeding assessment   Lactation consultant: Nonah Mattes RN,IBCLC Breastfeeding experience: Latching about 3 x a day, otherwise pumping and bottle feeding Maternal medical conditions: Pregnancy induced hypertension Maternal medications: Pre-natal vitamin, Other(Vitamin D, Baby Aspirin, Probitic, Mg Oxide, Nifedipine, Lyszess)  Breastfeeding History: Frequency of breast feeding: 3 x a day Duration of feeding: 10-20 minutes, usually one breast per feeding  Supplementation: Supplement method: bottle(Medela Calma or Avent) Brand: Similac(1/4 tsp Neosure 22 calorie formula powder to EBM)       Breast milk volume: 1.5-2.5 ounces Breast milk frequency: every 1-2 hours, self awakening   Pump type: Medela pump in style Pump frequency: 4-5 x a day Pump volume: 7-12 ounces per pumping  Infant Output Assessment: Voids per 24 hours: 8+ Urine color: Clear yellow Stools per 24 hours: 10-12 Stool color: Yellow  Breast Assessment: Breast: Filling, Compressible,  Hyperplasia Nipple: Erect Pain level: 3(with initial latch then decreases) Pain interventions: Bra, Breast pump  Feeding Assessment: Infant oral assessment: Variance Infant oral assessment comment: see note Positioning: Football(left breast, 20 minutes) Latch: 2 - Grasps breast easily, tongue down, lips flanged, rhythmical sucking. Audible swallowing: 2 - Spontaneous and  intermittent Type of nipple: 2 - Everted at rest and after stimulation Comfort: 1 - Filling, red/small blisters or bruises, mild/mod discomfort Hold: 2 - No assistance needed to correctly position infant at breast LATCH score: 9 Latch assessment: Deep Lips flanged: Yes Suck assessment: Displays both   Pre-feed weight: 2624 grams Post feed weight: 2686 grams Amount transferred: 62 ml    Additional Feeding Assessment:                                    Totals: Total amount transferred: 62 ml Total supplement given: 0 Total amount pumped post feed: did not pump   Plan:  1. Offer infant the breast with feeding cues for at least 4 times a day Make sure infant gets at least 8 feedings in 24 hours 2. Keep infant awake at the breast as needed to maintain active suckling 3. Feed infant skin to skin 4. Massage/compress breast with feeding to keep infant active at the breast 5. Offer both breasts with each feeding 6. Empty the first breast before offering the second breast 7. Pump off 2 ounces from the breast before latching infant to the breast to help with latch and getting infant the hind milk 8. Offer infant a bottle of pumped breast milk after breastfeeding if he is still cueing to feed, can use formula if no breast milk is available 9. Feed infant using the paced bottle feeding method (video on kellymom.com) 10. Infant needs about  49-64 ml (1.5-2 ounces) for 8 feedings a day or 390-520 ml (13-17 ounces) in 24 hours. Infant may take more or less depending on how often he feeds. Feed infant until he is satisfied.  11. Would recommend that you pump 8-12 x a day for 15-20 minutes with your double electric breast pump to protect and maintain your milk supply.  12. Keep up the good work 98. Thank you for allowing me to assist you today 14. Please call with any questions or concerns as needed [phone] 15. Follow up with Lactation as needed  Donn Pierini RN,  IBCLC                                                   Debby Freiberg Aaylah Pokorny 06/29/2019, 1:29 PM

## 2019-07-07 ENCOUNTER — Encounter (HOSPITAL_COMMUNITY): Payer: Federal, State, Local not specified - PPO

## 2019-07-14 ENCOUNTER — Inpatient Hospital Stay (HOSPITAL_COMMUNITY): Admit: 2019-07-14 | Payer: Self-pay

## 2019-10-24 IMAGING — MG 2D DIGITAL DIAGNOSTIC BILATERAL MAMMOGRAM WITH CAD AND ADJUNCT T
8 of 12 series · 8 of 28 positions shown · non-contrast
Comparison: Previous exam(s).

CLINICAL DATA: Followup for probably benign mass seen in the
slightly outer left breast on the CC view only, identified on
mammography only.

EXAM:
2D DIGITAL DIAGNOSTIC BILATERAL MAMMOGRAM WITH CAD AND ADJUNCT TOMO

[R CC synth-2D]
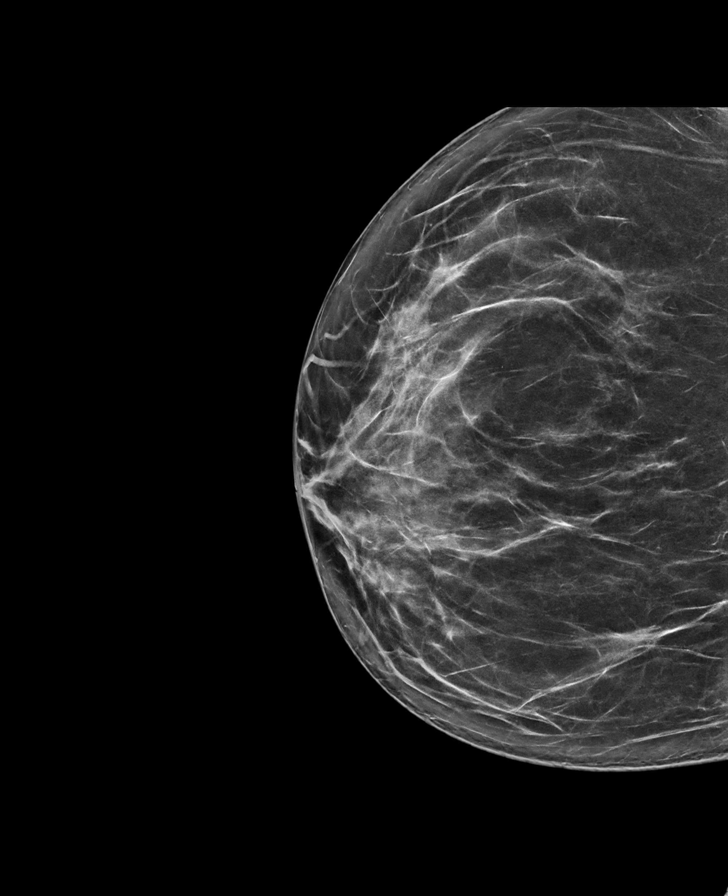

[L MLO]
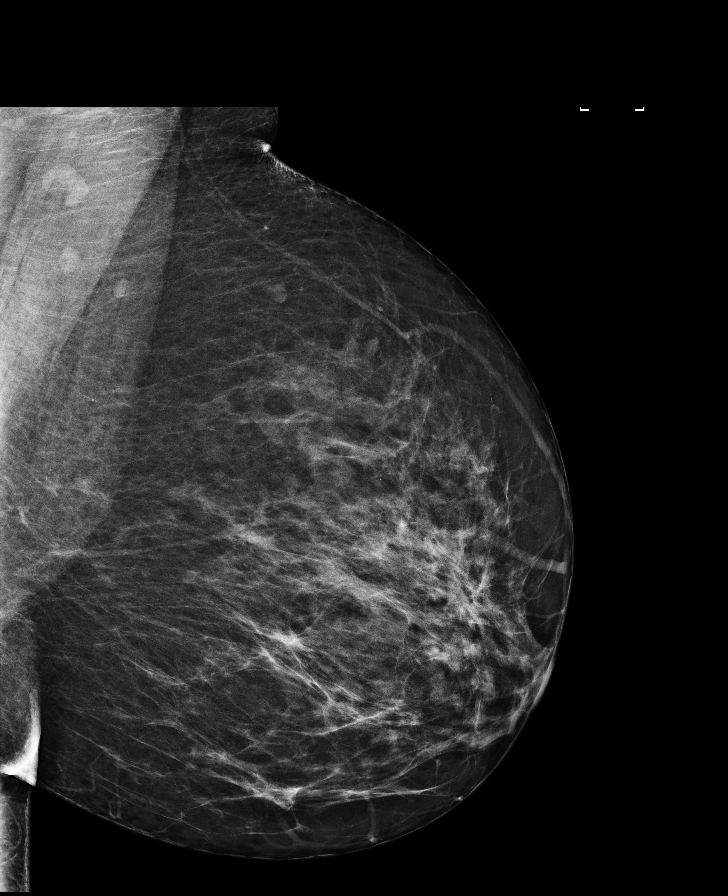

[L CC]
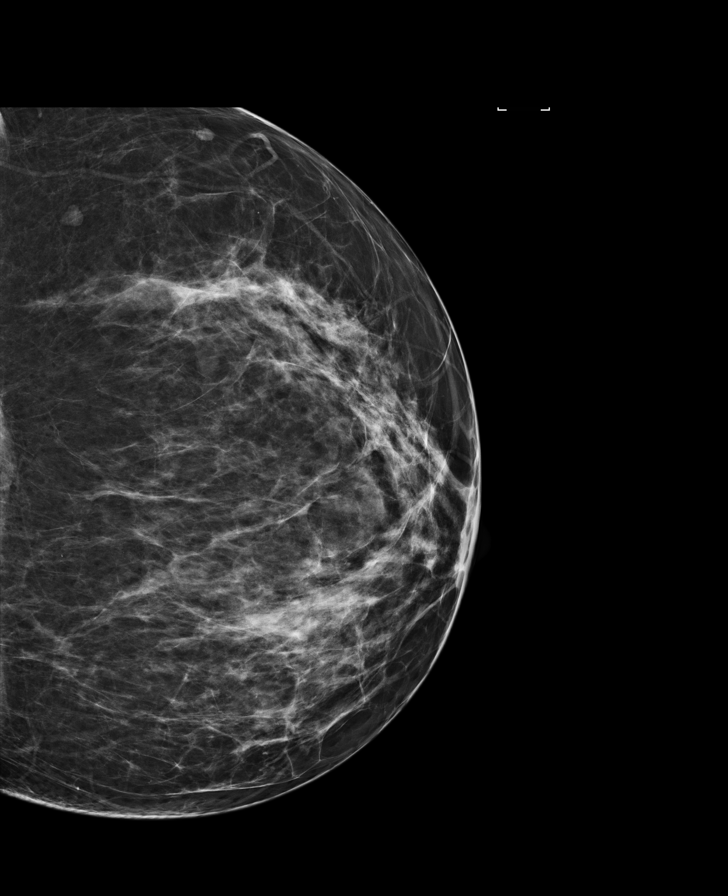

[R MLO]
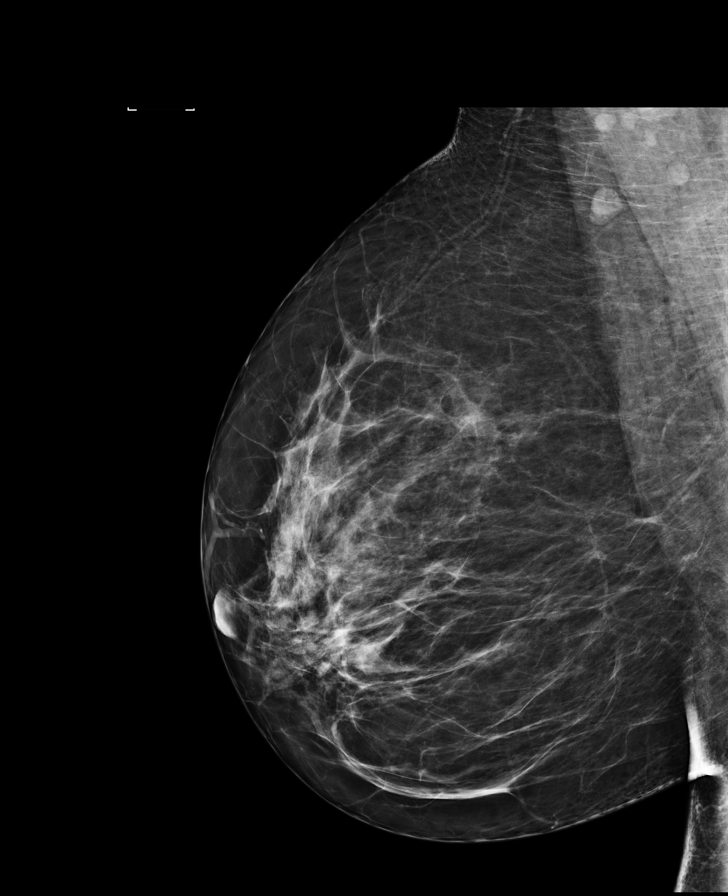

[L MLO synth-2D]
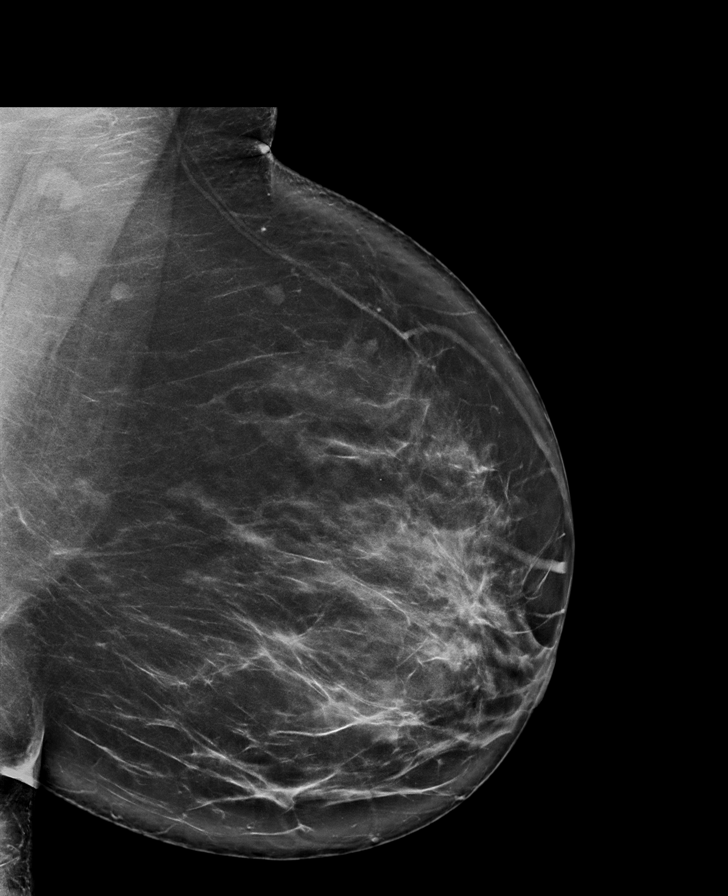

[L CC synth-2D]
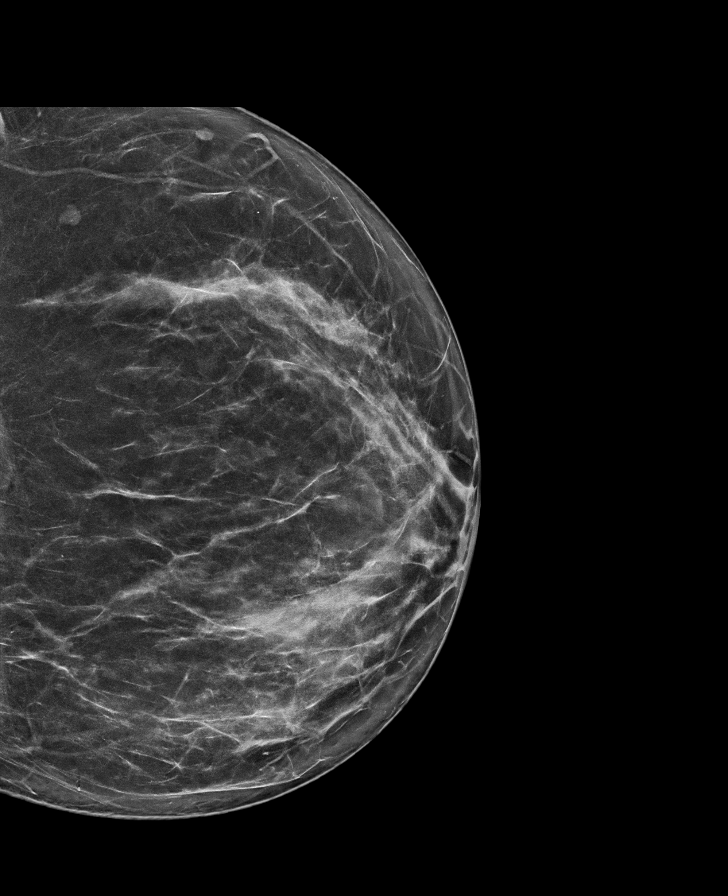

[R CC]
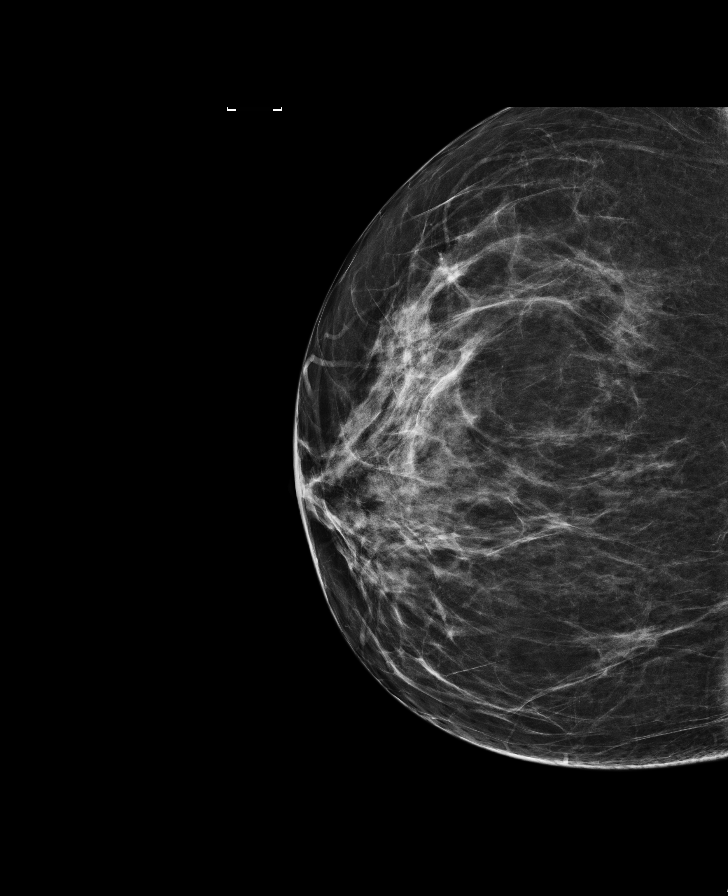

[R MLO synth-2D]
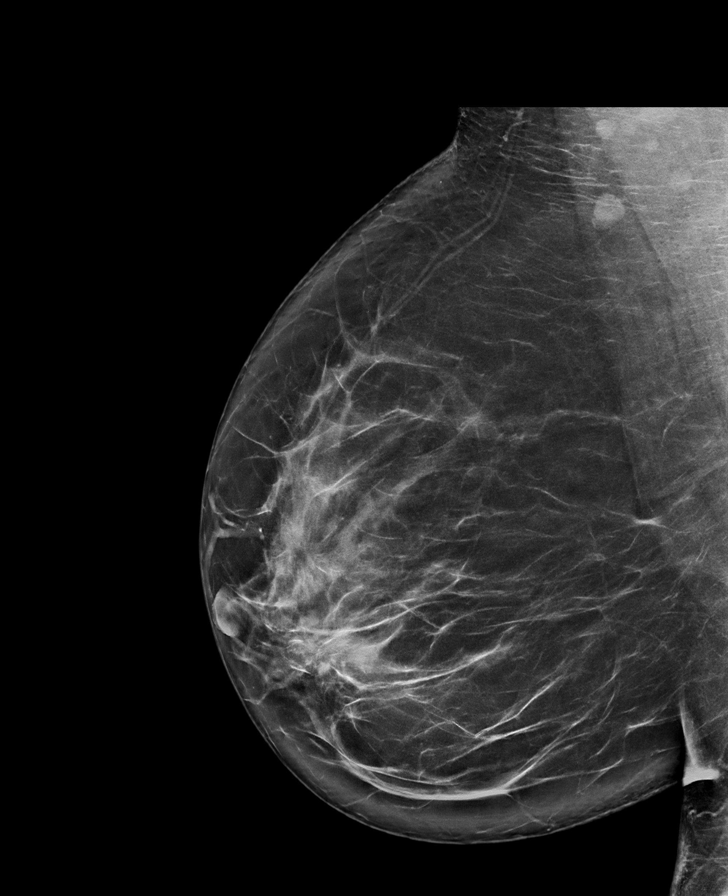

[8 of 28 positions shown; findings below may reference images not displayed]

ACR Breast Density Category c: The breast tissue is heterogeneously
dense, which may obscure small masses.
FINDINGS: No suspicious masses or calcifications seen in either breast. The
benign-appearing left breast mass seen in the slightly outer left
breast on the CC view appears stable to slightly smaller in size
when compared to the prior exams. There is no mammographic evidence
of malignancy in either breast.

Mammographic images were processed with CAD.
IMPRESSION: 1. Stable appearance of probably benign left breast mass seen on
mammography only.

2.  No mammographic evidence of malignancy in either breast.

RECOMMENDATION:
Bilateral diagnostic mammography in 12 months which will demonstrate
2 years of stability of the benign appearing left breast mass.

I have discussed the findings and recommendations with the patient.
Results were also provided in writing at the conclusion of the
visit. If applicable, a reminder letter will be sent to the patient
regarding the next appointment.

BI-RADS CATEGORY  3: Probably benign.

## 2021-02-14 ENCOUNTER — Other Ambulatory Visit: Payer: Self-pay | Admitting: Obstetrics and Gynecology

## 2021-02-14 DIAGNOSIS — R928 Other abnormal and inconclusive findings on diagnostic imaging of breast: Secondary | ICD-10-CM

## 2021-02-15 ENCOUNTER — Ambulatory Visit
Admission: RE | Admit: 2021-02-15 | Discharge: 2021-02-15 | Disposition: A | Discharge status: Home / Self Care | Payer: Federal, State, Local not specified - PPO | Source: Ambulatory Visit | Attending: Obstetrics and Gynecology | Admitting: Obstetrics and Gynecology

## 2021-02-15 ENCOUNTER — Other Ambulatory Visit: Payer: Self-pay | Admitting: Obstetrics and Gynecology

## 2021-02-15 ENCOUNTER — Other Ambulatory Visit: Payer: Self-pay

## 2021-02-15 DIAGNOSIS — R928 Other abnormal and inconclusive findings on diagnostic imaging of breast: Secondary | ICD-10-CM

## 2021-03-02 ENCOUNTER — Other Ambulatory Visit: Payer: No Typology Code available for payment source

## 2022-12-12 ENCOUNTER — Ambulatory Visit: Payer: Federal, State, Local not specified - PPO | Admitting: Dermatology

## 2023-06-09 ENCOUNTER — Ambulatory Visit: Payer: Federal, State, Local not specified - PPO | Admitting: Dermatology

## 2023-08-03 IMAGING — US US AXILLARY RIGHT
1 series · 8 of 8 positions shown · non-contrast
Comparison: Previous exam(s).

CLINICAL DATA: 40-year-old female presenting as a recall from
screening for possible bilateral enlarged axillary lymph nodes and a
small mass in the upper outer left breast.

EXAM:
ULTRASOUND OF THE LEFT BREAST
ULTRASOUND OF THE RIGHT AXILLA

[Series 1: us axillary right · 0.09mm/px · 8 of 8 slices shown]
[im 1/8]
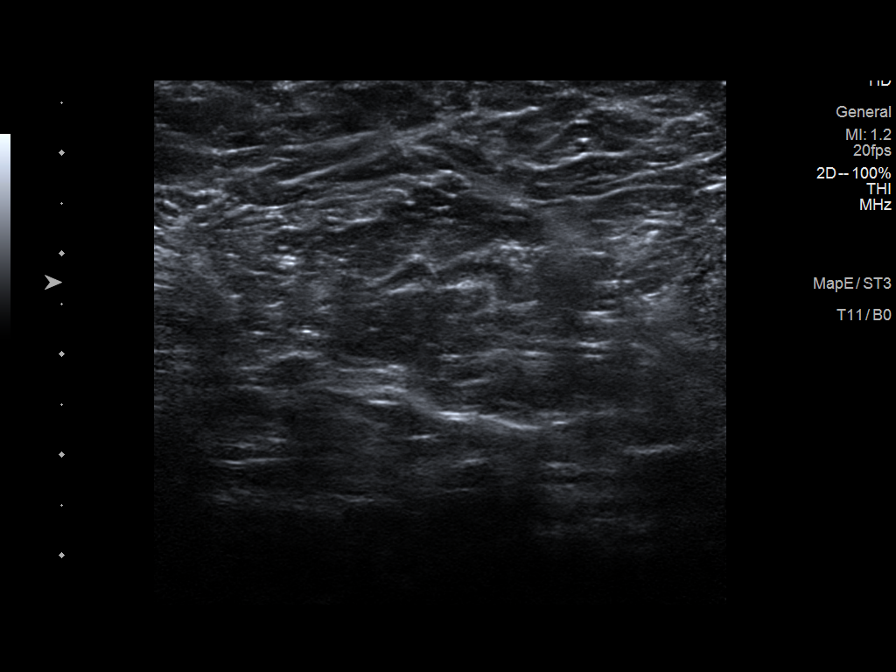
[im 2/8]
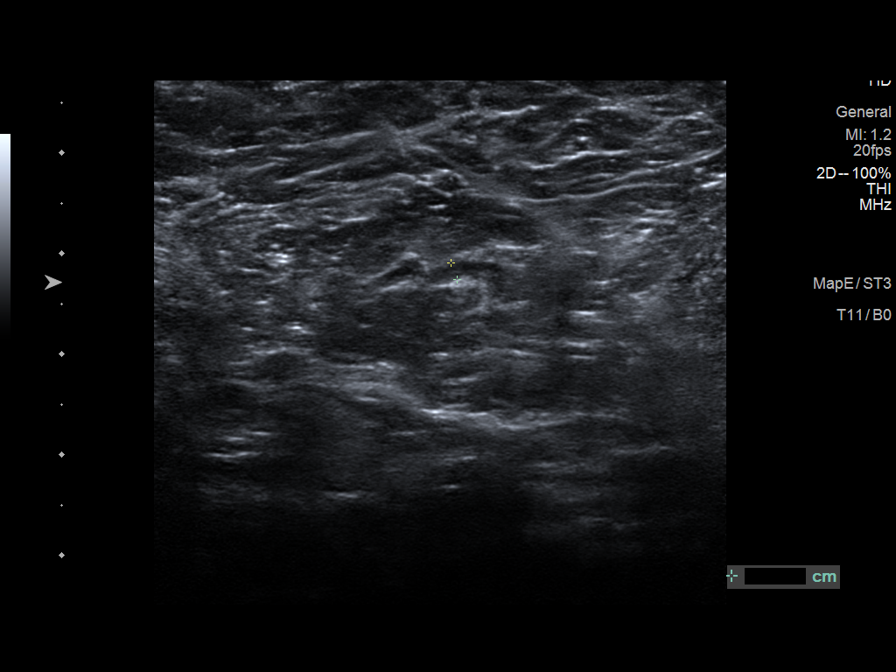
[im 3/8]
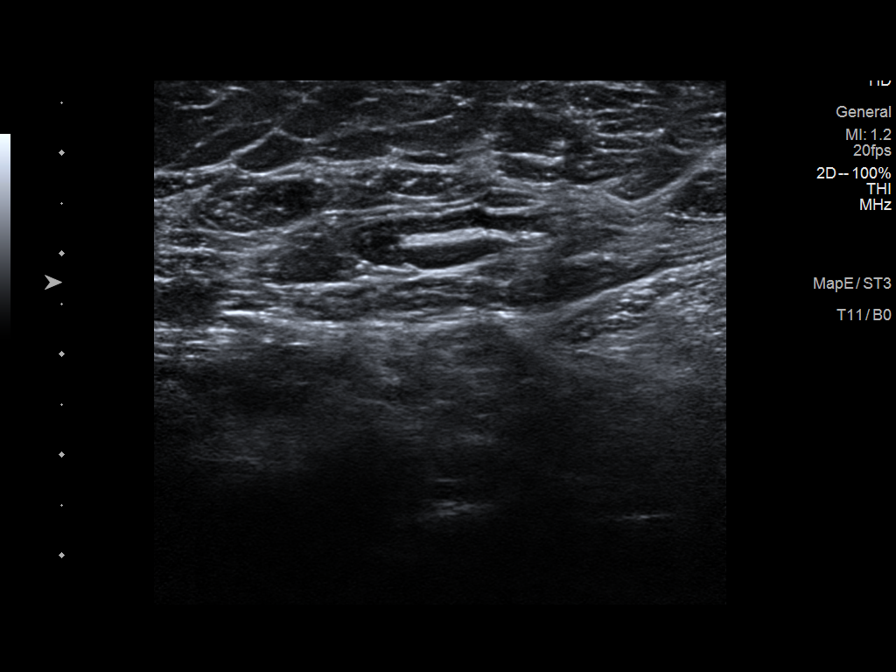
[im 4/8]
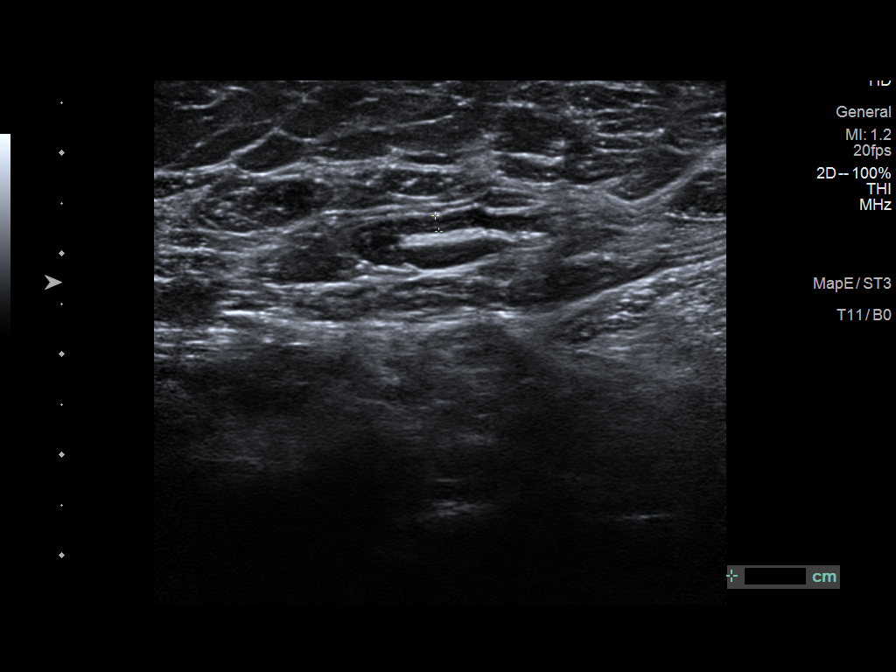
[im 5/8]
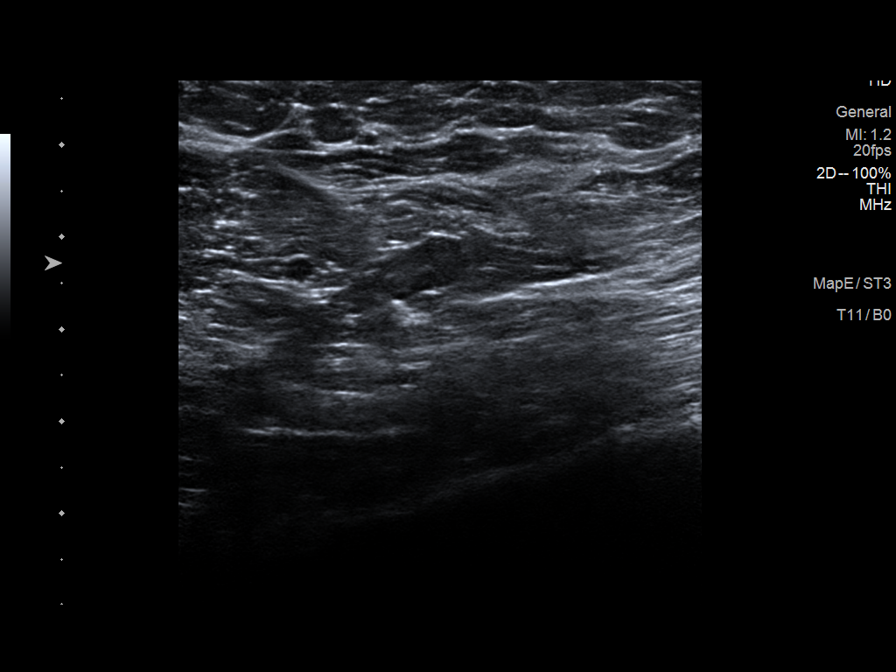
[im 6/8]
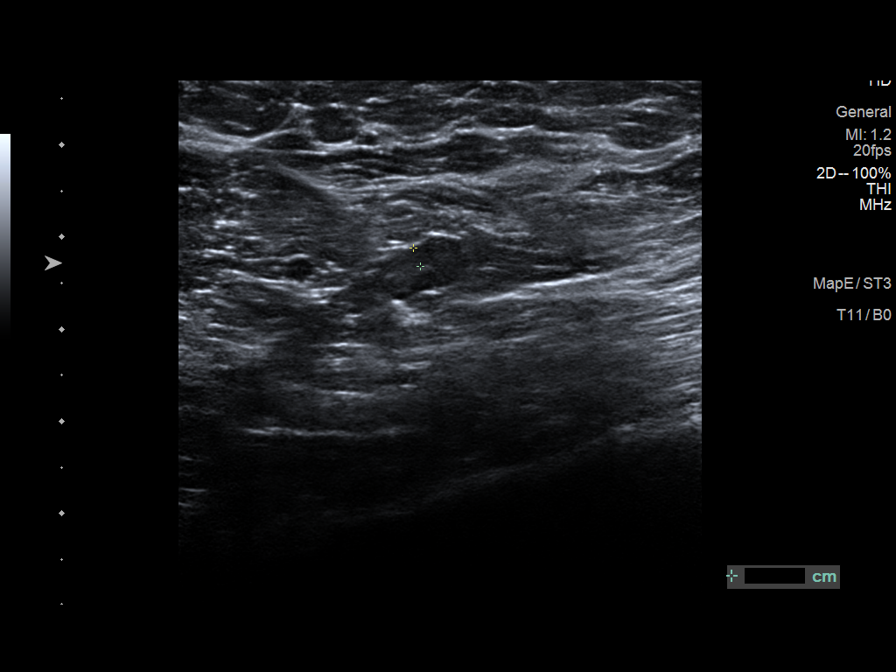
[im 7/8]
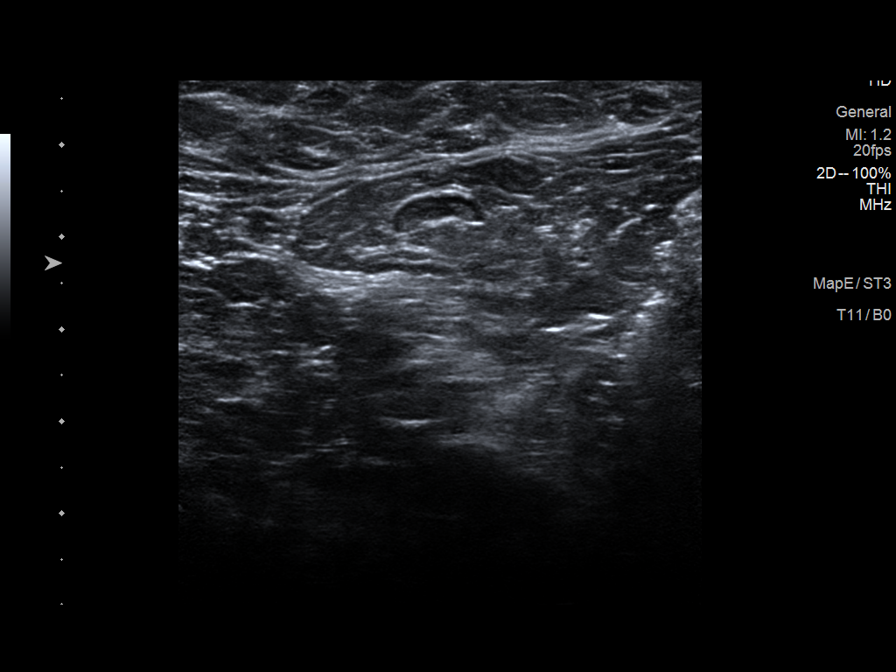
[im 8/8]
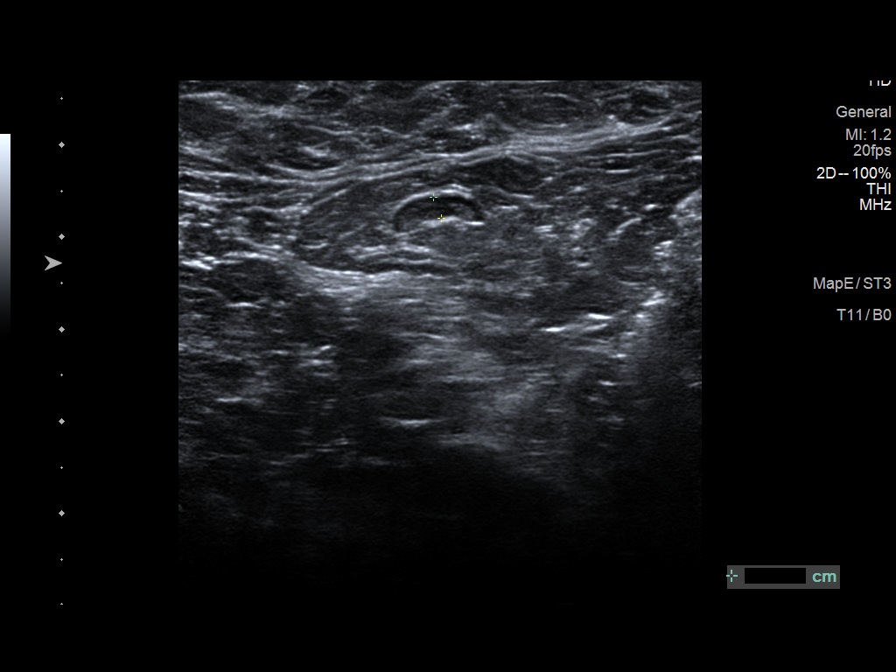

[8 of 8 positions shown; findings below may reference images not displayed]

FINDINGS: Right axilla:

Targeted ultrasound is performed in the right axilla demonstrating
lymph nodes with normal morphology including cortical thickness
measuring up to 0.2 cm. There is no suspicious lymph node or solid
mass.

Left axilla:

Targeted ultrasound performed in the left axilla demonstrating lymph
nodes with normal morphology including cortical thickness measuring
up to 0.4 cm. There is no suspicious solid mass or lymph node.

Left breast:

Targeted ultrasound performed in the left breast at 3 o'clock 9 cm
from the nipple demonstrating an oval circumscribed hypoechoic mass
with fatty hilum consistent with a benign intramammary lymph node.
This measures 0.5 x 0.3 x 0.5 cm and corresponds to the mammographic
finding.
IMPRESSION: Benign bilateral axillary lymph nodes and left intramammary lymph
node.

RECOMMENDATION:
Return to routine annual screening mammography which will be due in
January 2022.

I have discussed the findings and recommendations with the patient.
If applicable, a reminder letter will be sent to the patient
regarding the next appointment.

BI-RADS CATEGORY  2: Benign.

## 2023-08-03 IMAGING — US US BREAST*L* LIMITED INC AXILLA
1 series · 13 of 16 positions shown · non-contrast
Comparison: Previous exam(s).

CLINICAL DATA: 40-year-old female presenting as a recall from
screening for possible bilateral enlarged axillary lymph nodes and a
small mass in the upper outer left breast.

EXAM:
ULTRASOUND OF THE LEFT BREAST
ULTRASOUND OF THE RIGHT AXILLA

[Series 1: us breast*left* limited inc axilla · 0.08mm/px · 13 of 16 slices shown]
[im 1/16]
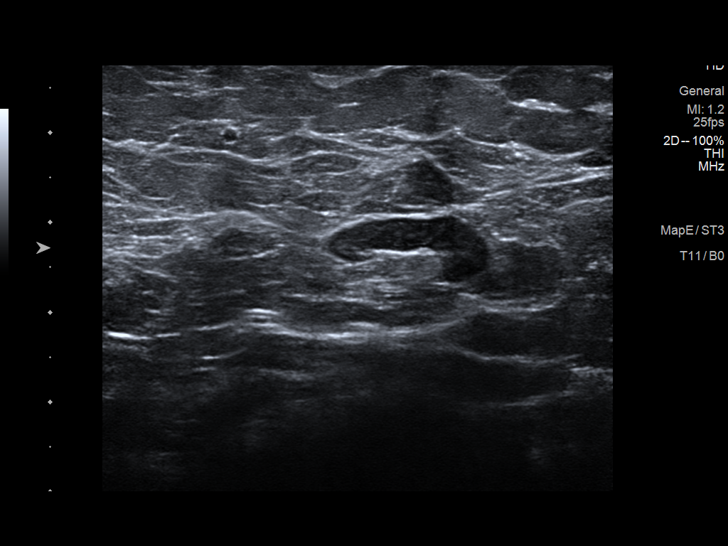
[im 2/16]
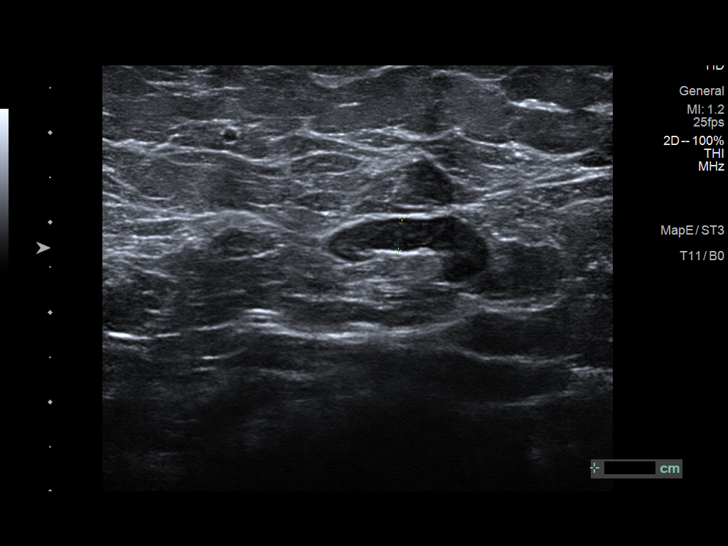
[im 4/16]
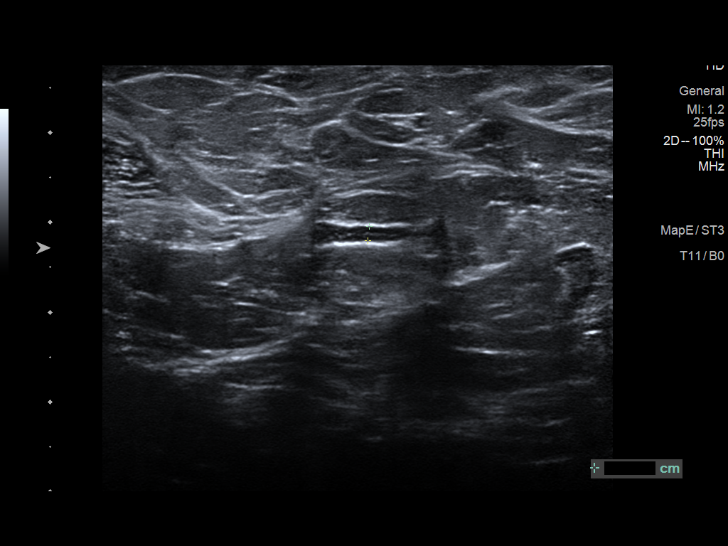
[im 5/16]
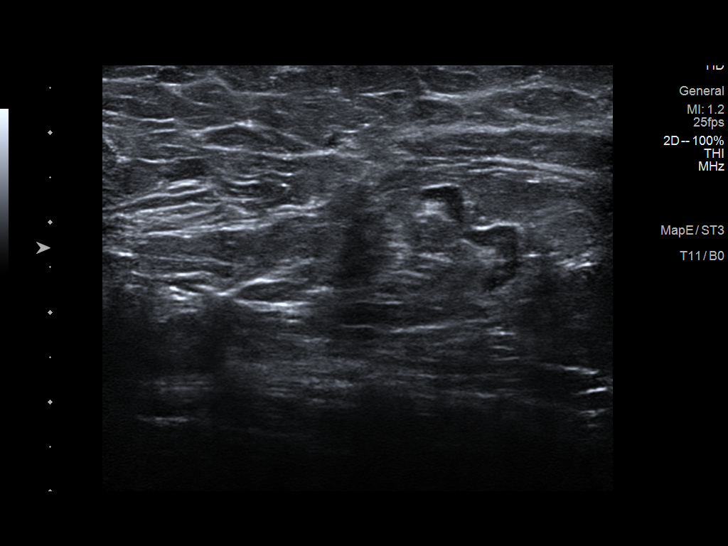
[im 6/16]
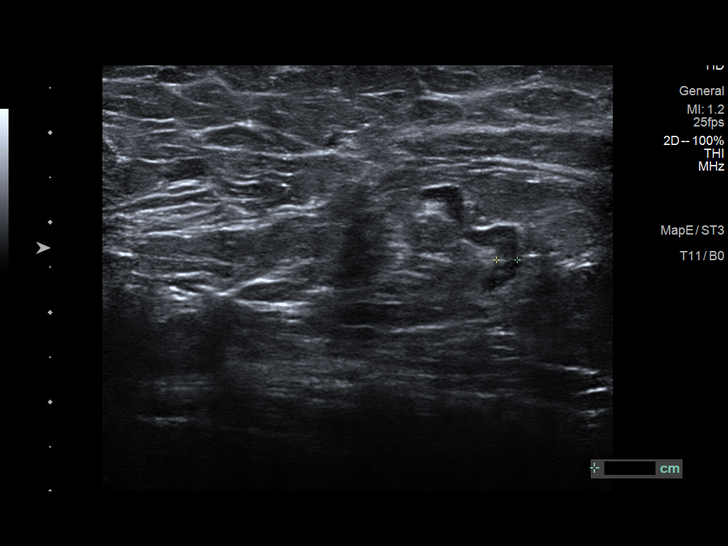
[im 7/16]
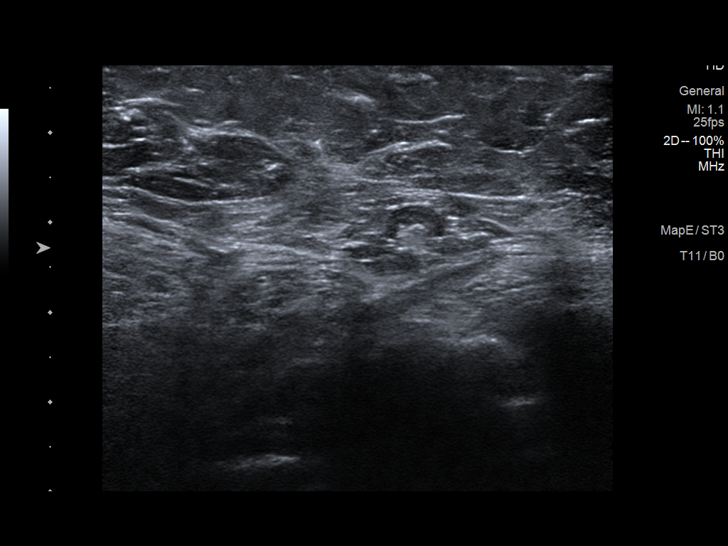
[im 9/16]
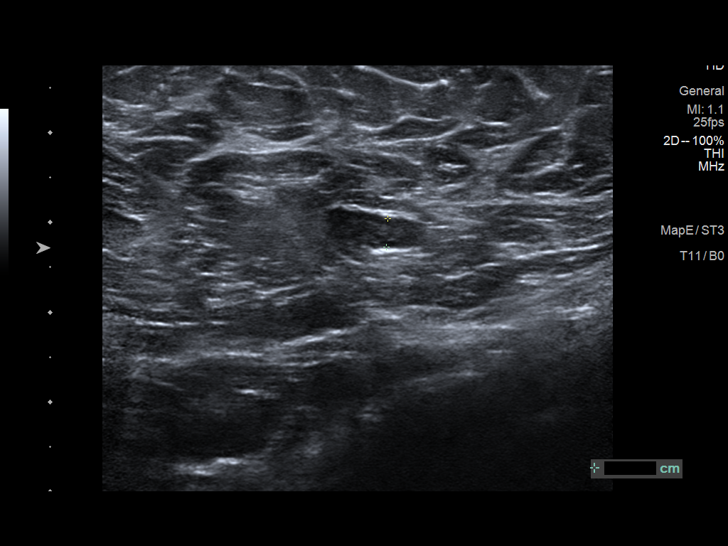
[im 10/16]
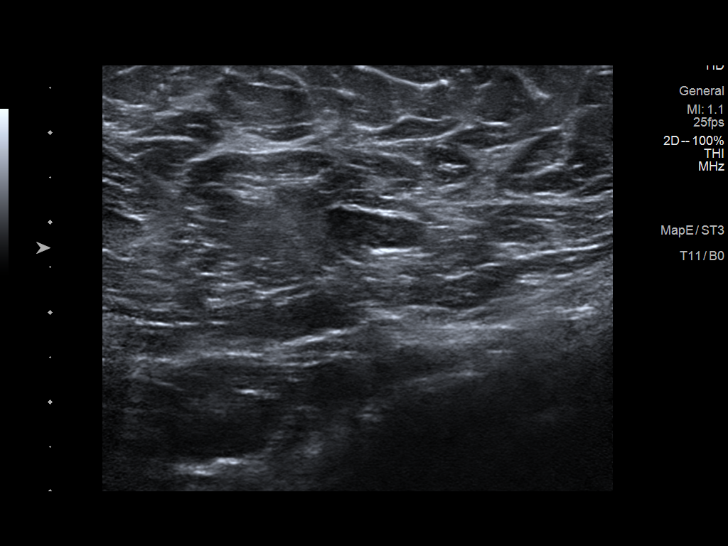
[im 11/16]
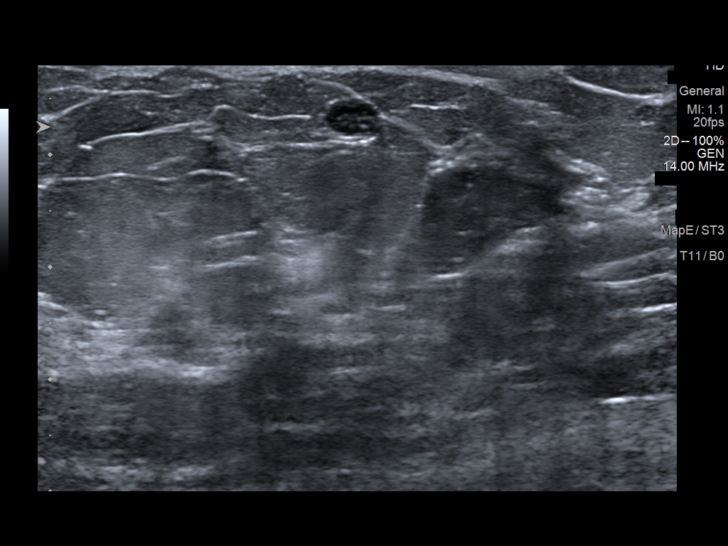
[im 12/16]
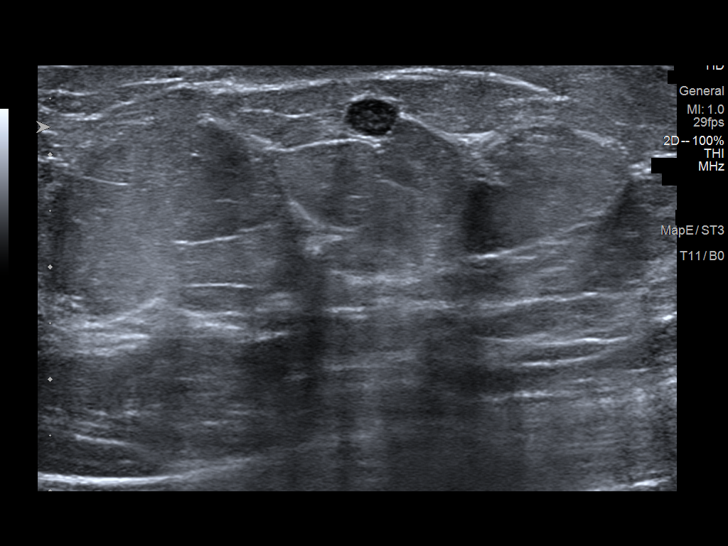
[im 13/16]
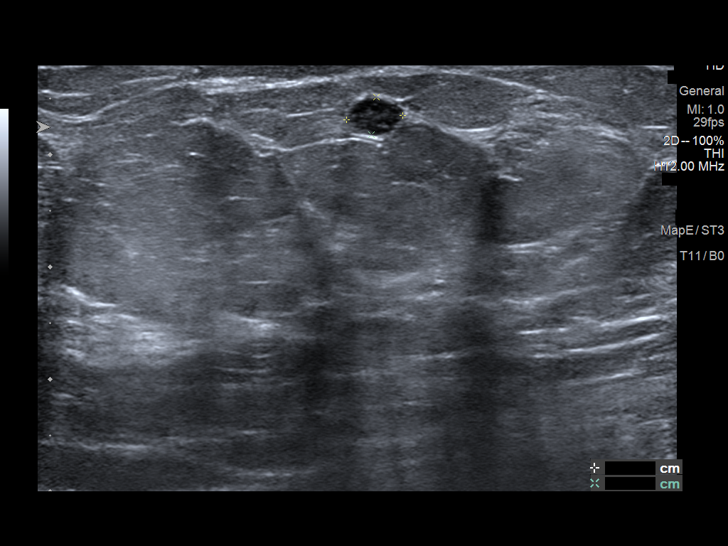
[im 15/16]
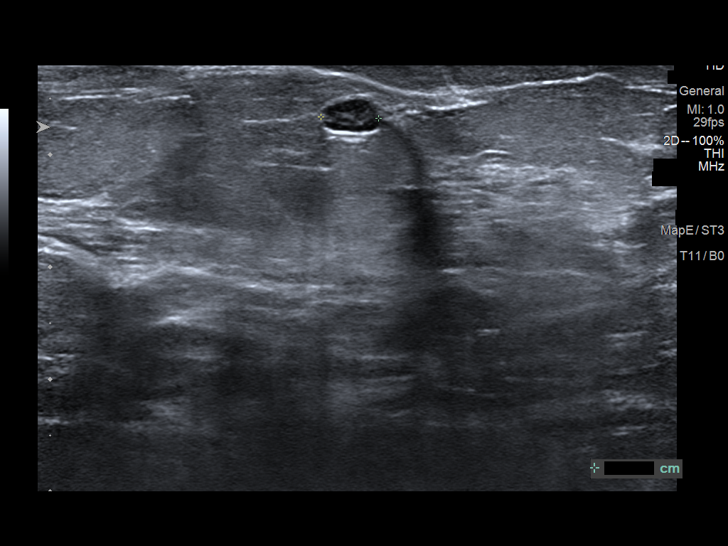
[im 16/16]
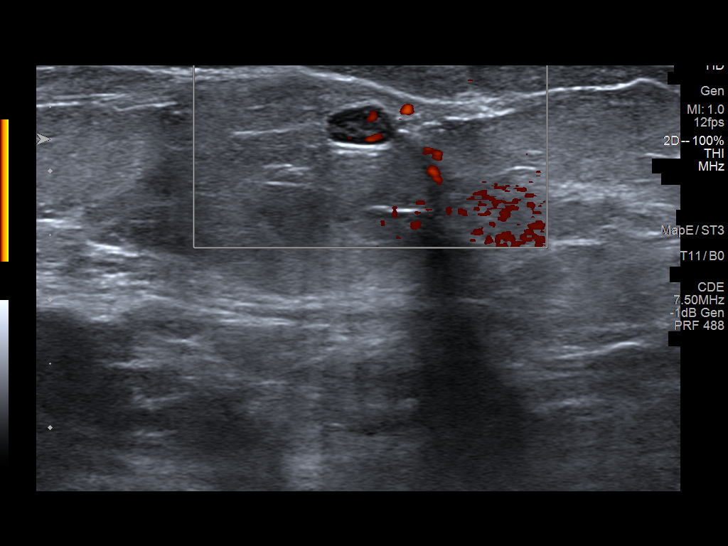

[13 of 16 positions shown; findings below may reference images not displayed]

FINDINGS: Right axilla:

Targeted ultrasound is performed in the right axilla demonstrating
lymph nodes with normal morphology including cortical thickness
measuring up to 0.2 cm. There is no suspicious lymph node or solid
mass.

Left axilla:

Targeted ultrasound performed in the left axilla demonstrating lymph
nodes with normal morphology including cortical thickness measuring
up to 0.4 cm. There is no suspicious solid mass or lymph node.

Left breast:

Targeted ultrasound performed in the left breast at 3 o'clock 9 cm
from the nipple demonstrating an oval circumscribed hypoechoic mass
with fatty hilum consistent with a benign intramammary lymph node.
This measures 0.5 x 0.3 x 0.5 cm and corresponds to the mammographic
finding.
IMPRESSION: Benign bilateral axillary lymph nodes and left intramammary lymph
node.

RECOMMENDATION:
Return to routine annual screening mammography which will be due in
January 2022.

I have discussed the findings and recommendations with the patient.
If applicable, a reminder letter will be sent to the patient
regarding the next appointment.

BI-RADS CATEGORY  2: Benign.

## 2023-08-25 ENCOUNTER — Ambulatory Visit (INDEPENDENT_AMBULATORY_CARE_PROVIDER_SITE_OTHER): Admitting: Dermatology

## 2023-08-25 ENCOUNTER — Encounter: Payer: Self-pay | Admitting: Dermatology

## 2023-08-25 DIAGNOSIS — L659 Nonscarring hair loss, unspecified: Secondary | ICD-10-CM

## 2023-08-25 DIAGNOSIS — L6681 Central centrifugal cicatricial alopecia: Secondary | ICD-10-CM

## 2023-08-25 DIAGNOSIS — L649 Androgenic alopecia, unspecified: Secondary | ICD-10-CM

## 2023-08-25 MED ORDER — SAFETY SEAL MISCELLANEOUS MISC: 5 refills | Status: AC

## 2023-08-25 NOTE — Progress Notes (Signed)
   New Patient Visit   Subjective  Bianca Smith is a 43 y.o. female who presents for the following: New Pt - Hair trhinning  Patient states she has hair thinning located at the scalp that she would like to have examined. Patient reports the areas have been there for 1 year. She reports the areas are not bothersome.Patient rates irritation 0 out of 10. She states that the areas have not spread. Patient reports she has previously been treated for these areas in 2019. Patient denied Hx of bx. Patient denied family history of skin cancer(s).   The following portions of the chart were reviewed this encounter and updated as appropriate: medications, allergies, medical history  Review of Systems:  No other skin or systemic complaints except as noted in HPI or Assessment and Plan.  Objective  Well appearing patient in no apparent distress; mood and affect are within normal limits.   A focused examination was performed of the following areas: scalp   Relevant exam findings are noted in the Assessment and Plan.          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 No erythema or flaking observed. Patient denies tenderness and itching. Contributory factors include a history of chemical relaxers (discontinued years ago), tight hairstyles, and strong family history of hair thinning in mother and aunts. Current hairstyle (sisterlocks) is appropriate, allowing scalp access without excessive tension. - Plan:    Prescribe compound minoxidil and clobetasol from [facility]     - Apply every morning    Recommend Vital Proteins collagen supplementation     - One scoop in coffee every morning    Recommend Viviscal supplements    Educate patient on gentle hair care practices    Inform  patient about expected timeline:     - Initial results may be visible in 4 months     - Significant improvement expected in 6 months to 1 year     - Some scarred areas may not regrow    Follow-up in 4 months to assess progress  ALOPECIA   Related Medications Safety Seal Miscellaneous MISC AA Gel - Minoxidil USP 8% Clobetasol USP 0.05% - apply to affected areas of the scalp daily QAM. Apply with a qtip or cotton ball.  Return in about 4 months (around 12/25/2023) for alopecia/CCCA.    Documentation: I have reviewed the above documentation for accuracy and completeness, and I agree with the above.   I, Shirron Louanne Roussel, CMA, am acting as scribe for [employer], DO.   Louana Roup, DO

## 2023-08-25 NOTE — Patient Instructions (Addendum)
 Hello Bianca Smith,  Thank you for visiting today. Here is a summary of the key instructions:  Diagnosis: Central Centrifugal Cicatricial Alopecia (CCCA)  - Medications:   - Apply compound minoxidil and clobetasol to scalp every morning   - Prescription will be sent to [facility]   - Pharmacy will call to verify address and mail medication  - Supplements:   - Take Vital Proteins collagen powder   - Mix one scoop in coffee every morning   - Take Viviscal supplements as directed  - Hair Care:   - Continue gentle hair care practices   - Maintain current hairstyle (sisterlocks) as it allows scalp access  - Follow-up:   - Expect to see initial results in about 4 months   - Full regrowth may take 6 months to a year   - Some scarred areas may not regrow hair  Please reach out if you have any questions or concerns.  Warm regards,  Dr. Louana Roup Dermatology              Important Information  Due to recent changes in healthcare laws, you may see results of your pathology and/or laboratory studies on MyChart before the doctors have had a chance to review them. We understand that in some cases there may be results that are confusing or concerning to you. Please understand that not all results are received at the same time and often the doctors may need to interpret multiple results in order to provide you with the best plan of care or course of treatment. Therefore, we ask that you please give us  2 business days to thoroughly review all your results before contacting the office for clarification. Should we see a critical lab result, you will be contacted sooner.   If You Need Anything After Your Visit  If you have any questions or concerns for your doctor, please call our main line at [phone] If no one answers, please leave a voicemail as directed and we will return your call as soon as possible. Messages left after 4 pm will be answered the following business day.   You  may also send us  a message via MyChart. We typically respond to MyChart messages within 1-2 business days.  For prescription refills, please ask your pharmacy to contact our office. Our fax number is [phone].  If you have an urgent issue when the clinic is closed that cannot wait until the next business day, you can page your doctor at the number below.    Please note that while we do our best to be available for urgent issues outside of office hours, we are not available 24/7.   If you have an urgent issue and are unable to reach us , you may choose to seek medical care at your doctor's office, retail clinic, urgent care center, or emergency room.  If you have a medical emergency, please immediately call 911 or go to the emergency department. In the event of inclement weather, please call our main line at [phone] for an update on the status of any delays or closures.  Dermatology Medication Tips: Please keep the boxes that topical medications come in in order to help keep track of the instructions about where and how to use these. Pharmacies typically print the medication instructions only on the boxes and not directly on the medication tubes.   If your medication is too expensive, please contact our office at [phone] or send us  a message through MyChart.   We  are unable to tell what your co-pay for medications will be in advance as this is different depending on your insurance coverage. However, we may be able to find a substitute medication at lower cost or fill out paperwork to get insurance to cover a needed medication.   If a prior authorization is required to get your medication covered by your insurance company, please allow us  1-2 business days to complete this process.  Drug prices often vary depending on where the prescription is filled and some pharmacies may offer cheaper prices.  The website www.goodrx.com contains coupons for medications through different  pharmacies. The prices here do not account for what the cost may be with help from insurance (it may be cheaper with your insurance), but the website can give you the price if you did not use any insurance.  - You can print the associated coupon and take it with your prescription to the pharmacy.  - You may also stop by our office during regular business hours and pick up a GoodRx coupon card.  - If you need your prescription sent electronically to a different pharmacy, notify our office through [facility] or by phone at [phone]

## 2023-09-25 ENCOUNTER — Ambulatory Visit: Payer: Federal, State, Local not specified - PPO | Admitting: Dermatology

## 2023-12-31 ENCOUNTER — Ambulatory Visit: Admitting: Dermatology

## 2023-12-31 ENCOUNTER — Encounter: Payer: Self-pay | Admitting: Dermatology

## 2023-12-31 DIAGNOSIS — L659 Nonscarring hair loss, unspecified: Secondary | ICD-10-CM

## 2023-12-31 DIAGNOSIS — L810 Postinflammatory hyperpigmentation: Secondary | ICD-10-CM

## 2023-12-31 DIAGNOSIS — L818 Other specified disorders of pigmentation: Secondary | ICD-10-CM

## 2023-12-31 NOTE — Patient Instructions (Addendum)
 Date: Wed Dec 31 2023  Hello Airelle,  Thank you for visiting today. Here is a summary of the key instructions:  - Medications:   - Use the compound medication every other day instead of daily   - Continue taking Viviscal and Vital Proteins supplements daily  - Skin Care: Morning: -Wash face in the morning - Apply vitamin C  - Apply Radiant Tone product - Apply sunscreen in the morning  Night:  -Wash face in the morning - Apply vitamin C  - Apply Radiant Tone product  - Eye Darkness:   - Use Eucerin Radiant Tone product morning and night for two weeks   - After two weeks, add Rock Retinol Eye Cream  - Follow-up:   - Contact the office if a medication refill is needed  Please reach out if you have any questions or concerns.  Warm regards,  Dr. Delon Lenis Dermatology     Important Information  Due to recent changes in healthcare laws, you may see results of your pathology and/or laboratory studies on MyChart before the doctors have had a chance to review them. We understand that in some cases there may be results that are confusing or concerning to you. Please understand that not all results are received at the same time and often the doctors may need to interpret multiple results in order to provide you with the best plan of care or course of treatment. Therefore, we ask that you please give us  2 business days to thoroughly review all your results before contacting the office for clarification. Should we see a critical lab result, you will be contacted sooner.   If You Need Anything After Your Visit  If you have any questions or concerns for your doctor, please call our main line at [phone] If no one answers, please leave a voicemail as directed and we will return your call as soon as possible. Messages left after 4 pm will be answered the following business day.   You may also send us  a message via MyChart. We typically respond to MyChart messages within 1-2  business days.  For prescription refills, please ask your pharmacy to contact our office. Our fax number is [phone].  If you have an urgent issue when the clinic is closed that cannot wait until the next business day, you can page your doctor at the number below.    Please note that while we do our best to be available for urgent issues outside of office hours, we are not available 24/7.   If you have an urgent issue and are unable to reach us , you may choose to seek medical care at your doctor's office, retail clinic, urgent care center, or emergency room.  If you have a medical emergency, please immediately call 911 or go to the emergency department. In the event of inclement weather, please call our main line at [phone] for an update on the status of any delays or closures.  Dermatology Medication Tips: Please keep the boxes that topical medications come in in order to help keep track of the instructions about where and how to use these. Pharmacies typically print the medication instructions only on the boxes and not directly on the medication tubes.   If your medication is too expensive, please contact our office at [phone] or send us  a message through MyChart.   We are unable to tell what your co-pay for medications will be in advance as this is different depending on your insurance coverage.  However, we may be able to find a substitute medication at lower cost or fill out paperwork to get insurance to cover a needed medication.   If a prior authorization is required to get your medication covered by your insurance company, please allow us  1-2 business days to complete this process.  Drug prices often vary depending on where the prescription is filled and some pharmacies may offer cheaper prices.  The website www.goodrx.com contains coupons for medications through different pharmacies. The prices here do not account for what the cost may be with help from insurance (it may  be cheaper with your insurance), but the website can give you the price if you did not use any insurance.  - You can print the associated coupon and take it with your prescription to the pharmacy.  - You may also stop by our office during regular business hours and pick up a GoodRx coupon card.  - If you need your prescription sent electronically to a different pharmacy, notify our office through [facility] or by phone at [phone]

## 2023-12-31 NOTE — Progress Notes (Signed)
   Follow-Up Visit   Subjective  Bianca Smith is a 43 y.o. female established patient who presents for FOLLOW UP on the diagnoses listed below:  Patient was last evaluated on 08/25/23.   Alopecia: Prescribed Medrock minoxidil/finasteride that pt is applying daily. She is taking Viviscal and Vital Protein daily. Patient reports sxs are better ans she and her hair dresser notices new growth. Scalp tenderness has resolved rating it 0 out of 10.    Add On: Pt would like to discuss dark marks on the face and recommendations for Tx.   Are you nursing, pregnant or trying to conceive? No   The following portions of the chart were reviewed this encounter and updated as appropriate: medications, allergies, medical history  Review of Systems:  No other skin or systemic complaints except as noted in HPI or Assessment and Plan.  Objective  Well appearing patient in no apparent distress; mood and affect are within normal limits.   A focused examination was performed of the following areas: scalp   Relevant exam findings are noted in the Assessment and Plan.                    Assessment & Plan   1. Alopecia - Assessment: Patient started compound treatment approximately 4 months ago with improved tenderness and visible hair regrowth, except for one area with scarring. Patient experienced tingling and tightness in scalp, possibly due to overuse of medication. Full extent of regrowth typically takes one year to manifest. Currently taking Vivoscal and Vital Proteins supplements daily.  - Plan:    Continue compound treatment, adjusting to every other day application if daily use causes sensitivity    Continue Vivoscal and Vital Proteins supplements daily    Educate patient on proper medication use to avoid buildup    Advise patient that some areas may not regrow due to scarring    Patient to contact office for prescription refill when needed  2. Under-eye Hyperpigmentation -  Assessment: Patient reports dark circles under the eyes. Currently using La Roche-Posay face wash, vitamin C, and moisturizer for skincare routine. - Plan:    Recommend over-the-counter Eucerin Radiant Tnoe for under-eye hyperpigmentation    Use morning and night for two weeks    After two weeks, add Rock Retinol Eye Cream    Provide samples of Eucerin dual active serum    Instruct on skincare routine: morning face wash, vitamin C, radiant tone, sunscreen; evening face wash, retinol, radiant tone, optional cream  Follow-up in 4 months to assess further regrowth.   No follow-ups on file.   Documentation: I have reviewed the above documentation for accuracy and completeness, and I agree with the above.  I, Shirron Maranda, CMA, am acting as scribe for [employer], DO.   Delon Lenis, DO

## 2024-06-21 ENCOUNTER — Ambulatory Visit: Admitting: Dermatology
